# Patient Record
Sex: Male | Born: 1955 | Race: White | Hispanic: No | Marital: Married | Smoking: Former smoker
Health system: Southern US, Community
[De-identification: ages and names within clinical notes are randomized; demographics above are authoritative.]

## PROBLEM LIST (undated history)

## (undated) DIAGNOSIS — E7800 Pure hypercholesterolemia, unspecified: Secondary | ICD-10-CM

## (undated) DIAGNOSIS — G4730 Sleep apnea, unspecified: Secondary | ICD-10-CM

## (undated) DIAGNOSIS — F329 Major depressive disorder, single episode, unspecified: Secondary | ICD-10-CM

## (undated) DIAGNOSIS — F32A Depression, unspecified: Secondary | ICD-10-CM

## (undated) DIAGNOSIS — K219 Gastro-esophageal reflux disease without esophagitis: Secondary | ICD-10-CM

## (undated) DIAGNOSIS — M1990 Unspecified osteoarthritis, unspecified site: Secondary | ICD-10-CM

## (undated) DIAGNOSIS — J449 Chronic obstructive pulmonary disease, unspecified: Secondary | ICD-10-CM

## (undated) DIAGNOSIS — A770 Spotted fever due to Rickettsia rickettsii: Secondary | ICD-10-CM

## (undated) DIAGNOSIS — D649 Anemia, unspecified: Secondary | ICD-10-CM

## (undated) DIAGNOSIS — I10 Essential (primary) hypertension: Secondary | ICD-10-CM

## (undated) HISTORY — DX: Sleep apnea, unspecified: G47.30

## (undated) HISTORY — DX: Spotted fever due to Rickettsia rickettsii: A77.0

## (undated) HISTORY — PX: CHOLECYSTECTOMY: SHX55

## (undated) HISTORY — PX: KNEE ARTHROSCOPY: SUR90

---

## 2001-11-28 ENCOUNTER — Encounter: Payer: Self-pay | Admitting: Internal Medicine

## 2001-11-28 ENCOUNTER — Ambulatory Visit (HOSPITAL_COMMUNITY): Admission: RE | Admit: 2001-11-28 | Discharge: 2001-11-28 | Payer: Self-pay | Admitting: Internal Medicine

## 2003-05-19 ENCOUNTER — Ambulatory Visit (HOSPITAL_COMMUNITY): Admission: RE | Admit: 2003-05-19 | Discharge: 2003-05-19 | Payer: Self-pay | Admitting: Internal Medicine

## 2003-10-23 ENCOUNTER — Ambulatory Visit (HOSPITAL_COMMUNITY): Admission: RE | Admit: 2003-10-23 | Discharge: 2003-10-23 | Payer: Self-pay | Admitting: Internal Medicine

## 2005-03-03 ENCOUNTER — Ambulatory Visit: Payer: Self-pay | Admitting: Internal Medicine

## 2005-03-15 HISTORY — PX: COLONOSCOPY: SHX5424

## 2005-03-21 ENCOUNTER — Encounter: Payer: Self-pay | Admitting: Internal Medicine

## 2005-03-21 ENCOUNTER — Ambulatory Visit: Admission: RE | Admit: 2005-03-21 | Discharge: 2005-03-21 | Payer: Self-pay | Admitting: Internal Medicine

## 2005-03-25 ENCOUNTER — Ambulatory Visit: Payer: Self-pay | Admitting: Internal Medicine

## 2005-03-25 ENCOUNTER — Encounter: Payer: Self-pay | Admitting: Internal Medicine

## 2005-03-25 ENCOUNTER — Ambulatory Visit (HOSPITAL_COMMUNITY): Admission: RE | Admit: 2005-03-25 | Discharge: 2005-03-25 | Payer: Self-pay | Admitting: Internal Medicine

## 2005-03-29 ENCOUNTER — Ambulatory Visit: Payer: Self-pay | Admitting: Pulmonary Disease

## 2005-04-25 ENCOUNTER — Ambulatory Visit: Payer: Self-pay | Admitting: Pulmonary Disease

## 2005-05-08 ENCOUNTER — Ambulatory Visit: Admission: RE | Admit: 2005-05-08 | Discharge: 2005-05-08 | Payer: Self-pay | Admitting: Pulmonary Disease

## 2005-05-08 ENCOUNTER — Ambulatory Visit: Payer: Self-pay | Admitting: Pulmonary Disease

## 2005-05-25 ENCOUNTER — Ambulatory Visit: Payer: Self-pay | Admitting: Pulmonary Disease

## 2005-07-28 ENCOUNTER — Ambulatory Visit: Payer: Self-pay | Admitting: Pulmonary Disease

## 2005-10-07 ENCOUNTER — Ambulatory Visit: Payer: Self-pay | Admitting: Pulmonary Disease

## 2006-09-18 ENCOUNTER — Ambulatory Visit (HOSPITAL_COMMUNITY): Admission: RE | Admit: 2006-09-18 | Discharge: 2006-09-18 | Payer: Self-pay | Admitting: Internal Medicine

## 2006-12-25 ENCOUNTER — Ambulatory Visit (HOSPITAL_COMMUNITY): Admission: RE | Admit: 2006-12-25 | Discharge: 2006-12-25 | Payer: Self-pay | Admitting: Internal Medicine

## 2009-01-01 ENCOUNTER — Ambulatory Visit: Payer: Self-pay | Admitting: Internal Medicine

## 2009-01-01 DIAGNOSIS — G4733 Obstructive sleep apnea (adult) (pediatric): Secondary | ICD-10-CM

## 2009-01-04 DIAGNOSIS — J309 Allergic rhinitis, unspecified: Secondary | ICD-10-CM | POA: Insufficient documentation

## 2009-01-04 DIAGNOSIS — I10 Essential (primary) hypertension: Secondary | ICD-10-CM | POA: Insufficient documentation

## 2009-01-13 ENCOUNTER — Telehealth (INDEPENDENT_AMBULATORY_CARE_PROVIDER_SITE_OTHER): Payer: Self-pay | Admitting: *Deleted

## 2009-01-27 ENCOUNTER — Encounter: Payer: Self-pay | Admitting: Internal Medicine

## 2009-02-01 ENCOUNTER — Telehealth: Payer: Self-pay | Admitting: Internal Medicine

## 2010-05-11 ENCOUNTER — Ambulatory Visit (HOSPITAL_COMMUNITY): Admission: RE | Admit: 2010-05-11 | Discharge: 2010-05-11 | Payer: Self-pay | Admitting: Internal Medicine

## 2010-05-24 ENCOUNTER — Ambulatory Visit (HOSPITAL_COMMUNITY): Admission: RE | Admit: 2010-05-24 | Discharge: 2010-05-24 | Payer: Self-pay | Admitting: Internal Medicine

## 2010-06-18 ENCOUNTER — Ambulatory Visit (HOSPITAL_COMMUNITY)
Admission: RE | Admit: 2010-06-18 | Discharge: 2010-06-18 | Payer: Self-pay | Source: Home / Self Care | Attending: Internal Medicine | Admitting: Internal Medicine

## 2010-11-24 ENCOUNTER — Other Ambulatory Visit (HOSPITAL_COMMUNITY): Payer: Self-pay | Admitting: Internal Medicine

## 2010-11-24 DIAGNOSIS — K769 Liver disease, unspecified: Secondary | ICD-10-CM

## 2010-11-26 NOTE — Procedures (Signed)
[name], Ryan [name]               ACCOUNT NO.:  [id number]   MEDICAL RECORD NO.:  [id number]          PATIENT TYPE:  OUT   LOCATION:  SLEEP CENTER                  FACILITY:  [facility]   PHYSICIAN:  Coralyn Helling, M.D.      DATE OF BIRTH:  [birth year]   DATE OF STUDY:  05/08/2005                              NOCTURNAL POLYSOMNOGRAM   DATE OF STUDY:  05/08/05   DATE OF BIRTH:  2056-06-23   INDICATION FOR STUDY:  This is a patient who had undergone an overnight  polysomnogram which showed evidence for obstructive sleep apnea and he was  referred to the sleep lab for a CPAP titration study.  His Epworth  sleepiness score is 5/24.   MEDICATIONS:  1.  Diovan.  2.  Norvasc.  3.  Zocor.   SLEEP ARCHITECTURE:  Total record time was 381.5 minutes.  Total sleep time  was 274 minutes.  Sleep efficiency was 72%.  There were 30 minutes of Stage  I sleep, 158.5 minutes of Stage II sleep, 39.5 minutes of stage slow wave  sleep and 46.5 minutes of REM sleep.  Sleep latency was 28.5 minutes which  is slightly prolonged.  REM latency is 189 minutes which was slightly  prolonged.  The patient was observed in both the supine and non-supine  positions.   RESPIRATORY DATA:  The patient was titrated from a pressure setting of 5 to  11 cm of water.  Snoring was noted in the initial parts of the study and  snoring was eliminated at 11 cm of water pressure and at 11 cm of water  pressure the apnea hypopnea index was reduced to 0.8 and he was observed in  both the supine position and in REM at this pressure setting and he appeared  to have improvement in his sleep architecture as well.   OXYGEN DATA:  His oxygen saturation nadir was 87%.  His mean oxygenation  during sleep was 93% and mean oxygenation during non-REM sleep was 92%.  Mean oxygenation during REM sleep was 92%.  He spent 369.7 minutes of the  test time with oxygenation between 91% to 100% and 11.3 minutes with  oxygenation between 81% and 90% and  on pressure setting of 11, his minimal  oxygenation was 90% with a mean of 93%.   CARDIAC DATA:  He had normal sinus rhythm with sinus bradycardia.   MOVEMENTS/PARASOMNIA:  He had a total of 396 periodic limb movements and 54  were associated with arousals.  His overall periodic limb movement index was  11.8.   IMPRESSION:  The patient was titrated from a pressure setting of 5 to 11 cm  of water at a presetting of 11 cm of water.  His sleep architecture  improved.  His apnea hypopnea index was reduced to 0.8 and his oxygenation  had stabilized and he was observed in both a supine position and REM sleep  and snoring was eliminated at this pressure setting and he was fitted with a  ComfortLite 2 with a small cushion size.  Therefore, the patient should be  set up with a CPAP with the pressure setting of  11 cm of water and monitored  for clinical response.      Coralyn Helling, M.D.  Diplomat, Biomedical engineer of Sleep Medicine  Electronically Signed     VS/MEDQ  D:  05/11/2005 13:57:52  T:  05/11/2005 19:25:58  Job:  [id number]

## 2010-11-26 NOTE — Op Note (Signed)
[name], Ryan               ACCOUNT NO.:  [id number]   MEDICAL RECORD NO.:  [id number]          PATIENT TYPE:  AMB   LOCATION:  DAY                           FACILITY:  [facility]   PHYSICIAN:  R. Roetta Sessions, M.D. DATE OF BIRTH:  [birth year]   DATE OF PROCEDURE:  03/25/2005  DATE OF DISCHARGE:                                 OPERATIVE REPORT   PROCEDURE:  Colonoscopy with biopsy.   INDICATIONS FOR PROCEDURE:  The patient is a 55 year old gentleman sent at  the courtesy of Dr. Carylon Perches for further evaluation of hemoccult positive  stool on recent digital exam.   [name] is really devoid of any lower GI symptoms. He has never had his  colon imaged. There is no family history of colorectal neoplasia, although  his mother died with metastatic carcinoma of unknown primary. Colonoscopy is  now being done to further evaluate hemoccult positive stools. This approach  has been discussed with the patient at length.  Potential risks, benefits,  and alternatives have been reviewed and questions answered. He is agreeable.  Please see documentation in the medical record.   PROCEDURE NOTE:  O2 saturation, blood pressure, pulse, and respirations were  monitored throughout the entire procedure. Conscious sedation with Versed 4  mg IV and Demerol 75 mg IV in divided doses.   INSTRUMENT:  Olympus video chip system.   FINDINGS:  Digital rectal exam revealed no abnormalities.   ENDOSCOPIC FINDINGS:  Prep was adequate.   Rectum:  Examination of the rectal mucosa including retroflexed view of the  anal verge revealed two rectal polyps, approximately 4 mm in diameter each,  one at 8 cm, one at 12 cm over the anal verge. The remainder of the rectal  mucosa appeared normal.   Colon:  Colonic mucosa was surveyed from the rectosigmoid junction through  the left, transverse, and right colon to the area of the appendiceal  orifice, ileocecal valve, and cecum. These structures were well seen and  photographed for the record. From this level, the scope was slowly  withdrawn, and all previously mentioned mucosal surfaces were again seen.  The colonic mucosa appeared normal. Two polyps in the rectum were cold  biopsied/removed. The patient tolerated the procedure well and was reactive  to endoscopy   IMPRESSION:  1.  Rectal polyps (diminutive), status post cold biopsy/removal. The      remainder of rectal mucosa appeared normal.  2.  Normal colon.   RECOMMENDATIONS:  1.  Follow up on pathology.  2.  Further recommendations to follow.      [name], M.D.  Electronically Signed     RMR/MEDQ  D:  03/25/2005  T:  03/25/2005  Job:  [id number]   cc:   Kingsley Callander. Ouida Sills, MD  [address]  [place]  [place] [address]  Fax: [phone]

## 2010-11-26 NOTE — Procedures (Signed)
[name], Ryan [name]               ACCOUNT NO.:  [id number]   MEDICAL RECORD NO.:  [id number]          PATIENT TYPE:  OUT   LOCATION:  SLEEP LAB                     FACILITY:  [facility]   PHYSICIAN:  Marcelyn Bruins, M.D. [facility] DATE OF BIRTH:  14-Mar-1956   DATE OF STUDY:  03/21/2005                              NOCTURNAL POLYSOMNOGRAM   REFERRING PHYSICIAN:  Dr. Carylon Perches   DATE OF STUDY:  March 21, 2005   INDICATION FOR THE STUDY:  Hypersomnia with sleep apnea. Epworth score: 7.   SLEEP ARCHITECTURE:  The patient had a total sleep time of 333 minutes with  adequate slow wave sleep but decreased REM, sleep onset latency was  prolonged at 45 minutes and REM onset was at the upper limits of normal.  Sleep efficiency was 84%.   RESPIRATORY DATA:  The patient was found to have a respiratory disturbance  index of 35 events per hour and were associated with loud to very loud  snoring. The events were clearly worse during REM.   OXYGEN DATA:  The patient had O2 desaturation as low as 85% associated with  his obstructive events.   CARDIAC DATA:  No clinically significant cardiac arrhythmias.   MOVEMENT/PARASOMNIA:  The patient was found to have very large numbers of  leg jerks with 81 of them resulting in arousal or awakening. This gave the  patient an arousal index of 15 events per hour.   IMPRESSION/RECOMMENDATION:  1.  Moderate obstructive sleep apnea/hypopnea syndrome with O2 desaturation      as low as 85%. Treatment for this degree of sleep apnea may include      weight loss, upper airway surgery, oral appliance, and also continuous      positive airway pressure. Clinical correlation is suggested.  2.  Large numbers of leg jerks with significant sleep disruption. More than      likely this is associated with the patient's obstructive sleep apnea,      however, consider additional treatment if the patient does not respond      appropriately to treatment of the patient's obstructive  events.                                           ______________________________  Marcelyn Bruins, M.D. [facility]  Diplomate, American Board of Sleep  Medicine    KC/MEDQ  D:  03/29/2005 08:51:38  T:  03/29/2005 18:38:21  Job:  [id number]

## 2010-11-26 NOTE — Consult Note (Signed)
NAMEMARSH, HECKLER               ACCOUNT NO.:  [id number]   MEDICAL RECORD NO.:  [id number]           PATIENT TYPE:  AMB   LOCATION:                                FACILITY:  [facility]   PHYSICIAN:  R. Roetta Sessions, M.D. DATE OF BIRTH:  1956-05-13   DATE OF CONSULTATION:  03/03/2005  DATE OF DISCHARGE:                                   CONSULTATION   REASON FOR CONSULTATION:  Hemoccult-positive stool.   HISTORY OF PRESENT ILLNESS:  Mr. Belal Scallon is a pleasant 55 year old  Caucasian male (who will be 50 in six days) referred at the courtesy of Dr.  Carylon Perches for further evaluation of Hemoccult-positive stool detected on  digital rectal examination during a routine physical recently.   Mr. Tim Lair has never seen any melena or blood per rectum.  He has a tendency  towards loose stool since Dr. Leona Carry removed his gallbladder 4-5 years  ago.  He has not had any abdominal pain.  No change in weight.  He has  occasional reflux symptoms, for which he takes Prevacid on the order of 4-5  times monthly.  There is no family history of colorectal neoplasia, although  his mother died of metastatic carcinoma, unknown primary, in her 75s.  He  has never had his lower GI tract imaged.   PAST MEDICAL HISTORY:  1.  Hypertension.  2.  Hypercholesterolemia.   PAST SURGICAL HISTORY:  Cholecystectomy by Dr. Leona Carry 4-5 years ago.   CURRENT MEDICATIONS:  1.  Zocor 40 mg at bedtime.  2.  Norvasc 5 mg daily.  3.  Diovan 160 mg daily.  4.  Prevacid 30 mg p.r.n.  5.  Sonata 10 mg p.r.n.   ALLERGIES:  No known drug allergies.   FAMILY HISTORY:  Father died with metastatic prostate cancer.  Mother died  with metastatic carcinoma, unknown primary.  Otherwise negative for chronic  GI or liver illness.   SOCIAL HISTORY:  The patient is married.  He has no children.  He is  employed with [employer] of [place], [place].  He is a  former smoker.  He has not smoked any in two years.   Occasionally, he has an  alcoholic beverage.   REVIEW OF SYSTEMS:  No odynophagia, dysphagia, early satiety, nausea,  vomiting or abdominal pain.  No chest pain or dyspnea on exertion.  No  change in weight.  No fever or chills.   PHYSICAL EXAMINATION:  GENERAL APPEARANCE:  Physical examination reveals a  pleasant, bearded 55 year old gentleman, resting comfortably.  VITAL SIGNS:  Weight 248.5.  Height 5-foot-10-1/2.  Temp 98.1, BP 122/76,  pulse 84.  SKIN:  Warm and dry.  No jaundice.  No cutaneous stigmata of chronic liver  disease.  HEENT:  The sclerae are anicteric.  JVD is not prominent.  Oral cavity:  No  lesions.  Dentition in a fair state of repair.  CHEST:  Lungs are clear to auscultation.  CARDIAC:  Regular rate and rhythm without murmur, gallop or rub.  ABDOMEN:  Nondistended.  Positive bowel sounds.  Obese.  Soft and  nontender.  No appreciable mass or hepatosplenomegaly.  EXTREMITIES:  No edema.  RECTAL:  Deferred to the time of colonoscopy.   IMPRESSION:  Mr. Messi Twedt is a pleasant 55 year old gentleman who was  found to be Hemoccult-positive on recent digital rectal examination.  Not  mentioned above, he did make the observation that he ate a very rare piece  of steak tonight before his rectal exam.  This may or may not be  significant.  At any rate, he needs lower GI tract images that are best  accomplished via colonoscopy.  I have discussed the approach of colonoscopy  with Mr. Tim Lair.  Potential risks, benefits and alternatives have been  reviewed and questions answered.  He is agreeable.  We will plan to perform  a colonoscopy in the very near future at [facility] and make  further recommendations after the procedure has been performed.   I would like to thank Dr. Carylon Perches for allowing me to assist with this nice  gentleman today.      [name], M.D.  Electronically Signed     RMR/MEDQ  D:  03/03/2005  T:  03/03/2005  Job:   [id number]   cc:   Kingsley Callander. Ouida Sills, MD  [address]  [place]  [place] [address]  Fax: [phone]

## 2010-11-30 ENCOUNTER — Ambulatory Visit (HOSPITAL_COMMUNITY)
Admission: RE | Admit: 2010-11-30 | Discharge: 2010-11-30 | Disposition: A | Discharge status: Home / Self Care | Payer: BC Managed Care – PPO | Source: Ambulatory Visit | Attending: Internal Medicine | Admitting: Internal Medicine

## 2010-11-30 ENCOUNTER — Encounter (HOSPITAL_COMMUNITY): Payer: Self-pay

## 2010-11-30 DIAGNOSIS — K769 Liver disease, unspecified: Secondary | ICD-10-CM

## 2010-11-30 DIAGNOSIS — K7689 Other specified diseases of liver: Secondary | ICD-10-CM | POA: Insufficient documentation

## 2010-11-30 DIAGNOSIS — I10 Essential (primary) hypertension: Secondary | ICD-10-CM | POA: Insufficient documentation

## 2010-11-30 HISTORY — DX: Essential (primary) hypertension: I10

## 2010-11-30 MED ORDER — IOHEXOL 300 MG/ML  SOLN
100.0000 mL | Freq: Once | INTRAMUSCULAR | Status: AC | PRN
  Administered 2010-11-30: 100 mL via INTRAVENOUS

## 2011-12-02 ENCOUNTER — Emergency Department (HOSPITAL_COMMUNITY)
Admission: EM | Admit: 2011-12-02 | Discharge: 2011-12-02 | Disposition: A | Discharge status: Home / Self Care | Payer: BC Managed Care – PPO | Attending: Emergency Medicine | Admitting: Emergency Medicine

## 2011-12-02 ENCOUNTER — Encounter (HOSPITAL_COMMUNITY): Payer: Self-pay | Admitting: *Deleted

## 2011-12-02 ENCOUNTER — Emergency Department (HOSPITAL_COMMUNITY): Payer: BC Managed Care – PPO

## 2011-12-02 DIAGNOSIS — S29011A Strain of muscle and tendon of front wall of thorax, initial encounter: Secondary | ICD-10-CM

## 2011-12-02 DIAGNOSIS — R079 Chest pain, unspecified: Secondary | ICD-10-CM | POA: Insufficient documentation

## 2011-12-02 HISTORY — DX: Unspecified osteoarthritis, unspecified site: M19.90

## 2011-12-02 HISTORY — DX: Chronic obstructive pulmonary disease, unspecified: J44.9

## 2011-12-02 HISTORY — DX: Pure hypercholesterolemia, unspecified: E78.00

## 2011-12-02 LAB — CBC
HCT: 38.4 % — ABNORMAL LOW (ref 39.0–52.0)
Hemoglobin: 12.9 g/dL — ABNORMAL LOW (ref 13.0–17.0)
MCH: 32 pg (ref 26.0–34.0)
MCHC: 33.6 g/dL (ref 30.0–36.0)
MCV: 95.3 fL (ref 78.0–100.0)
Platelets: 205 10*3/uL (ref 150–400)
RBC: 4.03 MIL/uL — ABNORMAL LOW (ref 4.22–5.81)
RDW: 11.9 % (ref 11.5–15.5)
WBC: 9.6 10*3/uL (ref 4.0–10.5)

## 2011-12-02 LAB — BASIC METABOLIC PANEL
BUN: 14 mg/dL (ref 6–23)
CO2: 29 mEq/L (ref 19–32)
Calcium: 10.1 mg/dL (ref 8.4–10.5)
Chloride: 99 mEq/L (ref 96–112)
Creatinine, Ser: 0.78 mg/dL (ref 0.50–1.35)
GFR calc Af Amer: >90 mL/min (ref >90)
GFR calc non Af Amer: >90 mL/min (ref >90)
Glucose, Bld: 105 mg/dL — ABNORMAL HIGH (ref 70–99)
Potassium: 5.1 mEq/L (ref 3.5–5.1)
Sodium: 136 mEq/L (ref 135–145)

## 2011-12-02 LAB — TROPONIN I: Troponin I: <0.3 ng/mL (ref <0.30)

## 2011-12-02 NOTE — ED Notes (Signed)
Pt c/o left sided sharp chest pain x 1 week but only when takes a deep breath. Denies any n/v or SOB.  Reports pain does not radiate.  Denies injury or cough.

## 2011-12-02 NOTE — ED Provider Notes (Signed)
History     CSN: [id number]  Arrival date & time 12/02/11  0272   First MD Initiated Contact with Patient 12/02/11 [phone]      Chief Complaint  Patient presents with  . Chest Pain    (Consider location/radiation/quality/duration/timing/severity/associated sxs/prior treatment) HPI Comments: Patient c/o pain to his left lateral chest for one week.  States the pain is worse with certain movements, palpation and cough.  Improves with rest and lying supine.  He has also been evaluated by his pulmonologist earlier this week for same and came to ED requesting a "second opinion",  He also reports an occasional cough that is mostly non-productive.  He denies SOB, fever, numbness, weakness or abd pain.    Patient is a 56 y.o. male presenting with chest pain. The history is provided by the patient.  Chest Pain The chest pain began 5 - 7 days ago. Chest pain occurs intermittently. The chest pain is unchanged. The pain is associated with breathing and lifting (palpation). The severity of the pain is mild. The quality of the pain is described as aching and dull. The pain does not radiate. Chest pain is worsened by certain positions, deep breathing and exertion. Primary symptoms include cough. Pertinent negatives for primary symptoms include no fever, no fatigue, no syncope, no shortness of breath, no wheezing, no palpitations, no abdominal pain, no nausea, no vomiting, no dizziness and no altered mental status.  The cough is non-productive. The sputum is clear.  Pertinent negatives for associated symptoms include no numbness and no weakness.  His past medical history is significant for COPD and diabetes.     Past Medical History  Diagnosis Date  . Hypertension   . COPD (chronic obstructive pulmonary disease)   . High cholesterol   . Arthritis   . Asthma     Past Surgical History  Procedure Date  . Cholecystectomy     No family history on file.  History  Substance Use Topics  . Smoking  status: Never Smoker   . Smokeless tobacco: Not on file  . Alcohol Use: Yes     daily      Review of Systems  Constitutional: Negative for fever, chills, activity change, appetite change and fatigue.  HENT: Negative for sore throat, trouble swallowing, neck pain and neck stiffness.   Respiratory: Positive for cough. Negative for chest tightness, shortness of breath and wheezing.   Cardiovascular: Positive for chest pain. Negative for palpitations, leg swelling and syncope.  Gastrointestinal: Negative for nausea, vomiting, abdominal pain, blood in stool and abdominal distention.  Genitourinary: Negative for dysuria, hematuria and flank pain.  Musculoskeletal: Positive for arthralgias. Negative for myalgias, back pain and gait problem.  Skin: Negative for rash.  Neurological: Negative for dizziness, weakness and numbness.  Hematological: Does not bruise/bleed easily.  Psychiatric/Behavioral: Negative for altered mental status.  All other systems reviewed and are negative.    Allergies  Latex  Home Medications  No current outpatient prescriptions on file.  BP 127/77  Pulse 76  Temp(Src) 98.2 F (36.8 C) (Oral)  Resp 20  Ht 5\' 10"  (1.778 m)  Wt 190 lb (86.183 kg)  BMI 27.26 kg/m2  SpO2 95%  Physical Exam  Nursing note and vitals reviewed. Constitutional: He is oriented to person, place, and time. He appears well-developed and well-nourished. No distress.  HENT:  Head: Normocephalic and atraumatic.  Mouth/Throat: Oropharynx is clear and moist.  Neck: Normal range of motion. Neck supple.  Cardiovascular: Normal rate, regular rhythm and  normal heart sounds.   Pulmonary/Chest: Effort normal and breath sounds normal. No respiratory distress. He has no decreased breath sounds. He has no wheezes. He has no rales. Chest wall is not dull to percussion. He exhibits tenderness. He exhibits no mass, no laceration, no crepitus, no edema, no deformity, no swelling and no retraction.     Abdominal: Soft. He exhibits no distension. There is no tenderness. There is no rebound and no guarding.  Musculoskeletal: Normal range of motion. He exhibits no tenderness.  Lymphadenopathy:    He has no cervical adenopathy.  Neurological: He is alert and oriented to person, place, and time. He exhibits normal muscle tone. Coordination normal.  Skin: Skin is warm and dry.    ED Course  Procedures (including critical care time)  Labs Reviewed  CBC - Abnormal; Notable for the following:    RBC 4.03 (*)    Hemoglobin 12.9 (*)    HCT 38.4 (*)    All other components within normal limits  BASIC METABOLIC PANEL - Abnormal; Notable for the following:    Glucose, Bld 105 (*)    All other components within normal limits  TROPONIN I       MDM    Date: 12/02/2011  Rate:73  Rhythm: normal sinus rhythm  QRS Axis: normal  Intervals: normal  ST/T Wave abnormalities: normal  Conduction Disutrbances:none  Narrative Interpretation:   Old EKG Reviewed: none available    EKG read by Dr. Adriana Simas   Previous medical charts, nursing notes and vitals signs from this visit were reviewed by me   All laboratory results and/or imaging results performed on this visit, if applicable, were reviewed by me and discussed with the patient and/or parent as well as recommendation for follow-up    MEDICATIONS GIVEN IN ED:  none  Patient brought chest x-ray from 11/28/2011 from his pulmonologist office in [place]. X-ray was reviewed by myself and Dr. Adriana Simas.  Patient is alert. Nontoxic appearing. Has localized tenderness to the left upper chest wall. Pain is reproduced with movement and deep inspiration. Symptoms are likely related to musculoskeletal pain. No tachycardia, tachypnea, or hypoxia. Doubtful of PE. I have discussed patient's symptoms and care plan with the EDP. Patient has also been seen by Dr. Adriana Simas. He agrees to close followup with his primary care physician or to return to ER if  his symptoms worsen.    PRESCRIPTIONS GIVEN AT DISCHARGE: none   Pt stable in ED with no significant deterioration in condition. Pt feels improved after observation and/or treatment in ED. Patient / Family / Caregiver understand and agree with initial ED impression and plan with expectations set for ED visit.  Patient agrees to return to ED for any worsening symptoms     Jessilyn Catino L. Trisha Mangle, [place] 12/08/11 1341

## 2011-12-02 NOTE — ED Notes (Signed)
Left sided rib pain x 6 days, worse with deep breath and movement.  Reports seen pulmonologist x 5 days ago.  Denies dizziness/lightheadedness, denies n/v.

## 2011-12-11 NOTE — ED Provider Notes (Signed)
Medical screening examination/treatment/procedure(s) were conducted as a shared visit with non-physician practitioner(s) and myself.  I personally evaluated the patient during the encounter.  doubt cardiac chest pain. ED workup negative. Will return if worse  Donnetta Hutching, MD 12/11/11 [phone]

## 2013-12-19 ENCOUNTER — Ambulatory Visit (HOSPITAL_COMMUNITY)
Admission: RE | Admit: 2013-12-19 | Discharge: 2013-12-19 | Disposition: A | Discharge status: Home / Self Care | Payer: BC Managed Care – PPO | Source: Ambulatory Visit | Attending: Internal Medicine | Admitting: Internal Medicine

## 2013-12-19 ENCOUNTER — Other Ambulatory Visit (HOSPITAL_COMMUNITY): Payer: Self-pay | Admitting: Internal Medicine

## 2013-12-19 DIAGNOSIS — M542 Cervicalgia: Secondary | ICD-10-CM

## 2013-12-19 DIAGNOSIS — M47812 Spondylosis without myelopathy or radiculopathy, cervical region: Secondary | ICD-10-CM | POA: Insufficient documentation

## 2014-07-10 ENCOUNTER — Encounter: Payer: Self-pay | Admitting: Internal Medicine

## 2014-08-08 ENCOUNTER — Ambulatory Visit (INDEPENDENT_AMBULATORY_CARE_PROVIDER_SITE_OTHER): Payer: BLUE CROSS/BLUE SHIELD | Admitting: Gastroenterology

## 2014-08-08 ENCOUNTER — Encounter: Payer: Self-pay | Admitting: Gastroenterology

## 2014-08-08 ENCOUNTER — Other Ambulatory Visit: Payer: Self-pay

## 2014-08-08 VITALS — BP 143/84 | HR 86 | Temp 98.1°F | Ht 70.0 in | Wt 215.8 lb

## 2014-08-08 DIAGNOSIS — D509 Iron deficiency anemia, unspecified: Secondary | ICD-10-CM | POA: Insufficient documentation

## 2014-08-08 MED ORDER — PEG-KCL-NACL-NASULF-NA ASC-C 100 G PO SOLR: 1.0000 | Freq: Once | ORAL | Status: AC

## 2014-08-08 NOTE — Patient Instructions (Signed)
We have scheduled you for a colonoscopy and upper endoscopy with Dr. Rourk in the near future.  Further recommendations to follow!  

## 2014-08-08 NOTE — Progress Notes (Signed)
Referring Provider: Asencion Noble, MD Primary Care Physician:  Asencion Noble, MD Primary Gastroenterologist:  Dr. Gala Romney   Chief Complaint  Patient presents with  . Anemia    HPI:   Ryan [name] is a 59 y.o. male presenting today at the request of Asencion Noble, MD secondary to [place]. Last colonoscopy in 2006 with hyperplastic polyps. Recent Hgb 12.4, with ferritin 7, iron low normal at 46.    No hematochezia or melena. No changes in bowel habits. No abdominal pain. No unexplained weight loss or lack of appetite. No reflux or dysphagia. Denies significant fatigue. meloxicam 1/2 tab every other day.   Purposeful weight loss of 75 lbs about 4 years ago, coming off hypertensive and cholesterol agents.     Past Medical History  Diagnosis Date  . Hypertension     diet controlled  . COPD (chronic obstructive pulmonary disease)   . High cholesterol     diet controlled  . Arthritis   . Asthma   . Sleep apnea     CPAP    Past Surgical History  Procedure Laterality Date  . Cholecystectomy    . Colonoscopy  03/15/2005    Dr. Rourk:Rectal polyps (diminutive), status post cold biopsy/removal/normal colon. hyperplastic    Current Outpatient Prescriptions  Medication Sig Dispense Refill  . acetaminophen (TYLENOL) 500 MG tablet Take by mouth.    . fluticasone-salmeterol (ADVAIR HFA) 115-21 MCG/ACT inhaler Inhale into the lungs.    . gabapentin (NEURONTIN) 100 MG capsule Take by mouth.    . lansoprazole (PREVACID SOLUTAB) 15 MG disintegrating tablet Take by mouth.    . levalbuterol (XOPENEX HFA) 45 MCG/ACT inhaler Inhale into the lungs.    . meloxicam (MOBIC) 15 MG tablet Take by mouth.    . mirtazapine (REMERON) 15 MG tablet Take by mouth.    . mometasone (NASONEX) 50 MCG/ACT nasal spray     . traMADol (ULTRAM) 50 MG tablet Take by mouth.    . zolpidem (AMBIEN) 10 MG tablet Take by mouth.    . peg 3350 powder (MOVIPREP) 100 G SOLR Take 1 kit (200 g total) by mouth once. 1 kit 0   No  current facility-administered medications for this visit.    Allergies as of 08/08/2014 - Review Complete 12/02/2011  Allergen Reaction Noted  . Latex  11/30/2010    Family History  Problem Relation Age of Onset  . Colon cancer Neg Hx   . Cancer Mother     unsure primary    History   Social History  . Marital Status: Married    Spouse Name: N/A    Number of Children: N/A  . Years of Education: N/A   Occupational History  . [employer]    Social History Main Topics  . Smoking status: Never Smoker   . Smokeless tobacco: Not on file  . Alcohol Use: 0.0 oz/week    0 Not specified per week     Comment: couple of drinks of borboun every night  . Drug Use: No  . Sexual Activity: Not on file   Other Topics Concern  . Not on file   Social History Narrative    Review of Systems: Gen: Denies any fever, chills, loss of appetite, fatigue, weight loss. CV: Denies chest pain, heart palpitations, syncope, peripheral edema. Resp: Denies shortness of breath with rest, cough, wheezing GI: see HPI GU : Denies urinary burning, urinary frequency, urinary incontinence.  MS: Denies joint pain, muscle weakness, cramps, limited  movement Derm: Denies rash, itching, dry skin Psych: Denies depression, anxiety, confusion or memory loss  Heme: Denies bruising, bleeding, and enlarged lymph nodes.  Physical Exam: BP 143/84 mmHg  Pulse 86  Temp(Src) 98.1 F (36.7 C) (Oral)  Ht $R'5\' 10"'et$  (1.778 m)  Wt 215 lb 12.8 oz (97.886 kg)  BMI 30.96 kg/m2 General:   Alert and oriented. Well-developed, well-nourished, pleasant and cooperative. Head:  Normocephalic and atraumatic. Eyes:  Conjunctiva pink, sclera clear, no icterus.   Conjunctiva pink. Ears:  Normal auditory acuity. Nose:  No deformity, discharge,  or lesions. Mouth:  No deformity or lesions, mucosa pink and moist.  Lungs:  Clear to auscultation bilaterally, without wheezing, rales, or rhonchi.  Heart:  S1, S2 present  without murmurs noted.  Abdomen:  +BS, soft, non-tender and non-distended. Without mass or HSM. No rebound or guarding. No hernias noted. Rectal:  Deferred  Msk:  Symmetrical without gross deformities. Normal posture. Extremities:  Without edema. Neurologic:  Alert and  oriented x4;  grossly normal neurologically. Skin:  Intact, warm and dry without significant lesions or rashes Psych:  Alert and cooperative. Normal mood and affect.   Outside labs to be abstracted 06/2014:   Hgb 12.4, with ferritin 7, iron low normal at 46.

## 2014-08-13 NOTE — Assessment & Plan Note (Signed)
59 year old male with incidental findings of IDA without overt signs of GI bleeding or known hemoccult status. No concerning upper or lower GI symptoms of note. With last colonoscopy in 2006, needs colonoscopy and EGD to evaluate for etiology to IDA. As of note, takes meloxicam 1/2 tab every other day, otherwise no NSAIDs or aspirin powders.   Proceed with TCS/EGD with Dr. Gala Romney in near future: the risks, benefits, and alternatives have been discussed with the patient in detail. The patient states understanding and desires to proceed.

## 2014-08-21 LAB — HEMOGLOBIN
FERRITIN: 7
HCT: 38 %
Hgb Other: 12.4

## 2014-08-26 NOTE — Progress Notes (Signed)
cc'ed to pcp °

## 2014-08-27 ENCOUNTER — Encounter (HOSPITAL_COMMUNITY)
Admission: RE | Disposition: A | Discharge status: Home / Self Care | Payer: Self-pay | Source: Ambulatory Visit | Attending: Internal Medicine

## 2014-08-27 ENCOUNTER — Encounter (HOSPITAL_COMMUNITY): Payer: Self-pay | Admitting: *Deleted

## 2014-08-27 ENCOUNTER — Ambulatory Visit (HOSPITAL_COMMUNITY)
Admission: RE | Admit: 2014-08-27 | Discharge: 2014-08-27 | Disposition: A | Discharge status: Home / Self Care | Payer: BLUE CROSS/BLUE SHIELD | Source: Ambulatory Visit | Attending: Internal Medicine | Admitting: Internal Medicine

## 2014-08-27 DIAGNOSIS — D128 Benign neoplasm of rectum: Secondary | ICD-10-CM

## 2014-08-27 DIAGNOSIS — Z800 Family history of malignant neoplasm of digestive organs: Secondary | ICD-10-CM | POA: Insufficient documentation

## 2014-08-27 DIAGNOSIS — Z7951 Long term (current) use of inhaled steroids: Secondary | ICD-10-CM | POA: Diagnosis not present

## 2014-08-27 DIAGNOSIS — K2950 Unspecified chronic gastritis without bleeding: Secondary | ICD-10-CM | POA: Diagnosis not present

## 2014-08-27 DIAGNOSIS — E780 Pure hypercholesterolemia: Secondary | ICD-10-CM | POA: Insufficient documentation

## 2014-08-27 DIAGNOSIS — D509 Iron deficiency anemia, unspecified: Secondary | ICD-10-CM

## 2014-08-27 DIAGNOSIS — K635 Polyp of colon: Secondary | ICD-10-CM | POA: Diagnosis not present

## 2014-08-27 DIAGNOSIS — K449 Diaphragmatic hernia without obstruction or gangrene: Secondary | ICD-10-CM | POA: Insufficient documentation

## 2014-08-27 DIAGNOSIS — Z9049 Acquired absence of other specified parts of digestive tract: Secondary | ICD-10-CM | POA: Insufficient documentation

## 2014-08-27 DIAGNOSIS — Z91040 Latex allergy status: Secondary | ICD-10-CM | POA: Diagnosis not present

## 2014-08-27 DIAGNOSIS — J449 Chronic obstructive pulmonary disease, unspecified: Secondary | ICD-10-CM | POA: Insufficient documentation

## 2014-08-27 DIAGNOSIS — J45909 Unspecified asthma, uncomplicated: Secondary | ICD-10-CM | POA: Diagnosis not present

## 2014-08-27 DIAGNOSIS — Q2730 Arteriovenous malformation, site unspecified: Secondary | ICD-10-CM

## 2014-08-27 DIAGNOSIS — K921 Melena: Secondary | ICD-10-CM | POA: Diagnosis present

## 2014-08-27 DIAGNOSIS — Z860100 Personal history of colon polyps, unspecified: Secondary | ICD-10-CM | POA: Insufficient documentation

## 2014-08-27 DIAGNOSIS — K621 Rectal polyp: Secondary | ICD-10-CM | POA: Insufficient documentation

## 2014-08-27 DIAGNOSIS — D122 Benign neoplasm of ascending colon: Secondary | ICD-10-CM

## 2014-08-27 DIAGNOSIS — G4730 Sleep apnea, unspecified: Secondary | ICD-10-CM | POA: Insufficient documentation

## 2014-08-27 DIAGNOSIS — I10 Essential (primary) hypertension: Secondary | ICD-10-CM | POA: Diagnosis not present

## 2014-08-27 DIAGNOSIS — K5730 Diverticulosis of large intestine without perforation or abscess without bleeding: Secondary | ICD-10-CM | POA: Diagnosis not present

## 2014-08-27 DIAGNOSIS — K210 Gastro-esophageal reflux disease with esophagitis: Secondary | ICD-10-CM | POA: Diagnosis not present

## 2014-08-27 DIAGNOSIS — K2210 Ulcer of esophagus without bleeding: Secondary | ICD-10-CM | POA: Insufficient documentation

## 2014-08-27 DIAGNOSIS — K3189 Other diseases of stomach and duodenum: Secondary | ICD-10-CM

## 2014-08-27 HISTORY — PX: COLONOSCOPY: SHX5424

## 2014-08-27 HISTORY — PX: ESOPHAGOGASTRODUODENOSCOPY: SHX5428

## 2014-08-27 SURGERY — COLONOSCOPY
Anesthesia: Moderate Sedation

## 2014-08-27 MED ORDER — MIDAZOLAM HCL 5 MG/5ML IJ SOLN
INTRAMUSCULAR | Status: AC
  Filled 2014-08-27: qty 10

## 2014-08-27 MED ORDER — MEPERIDINE HCL 100 MG/ML IJ SOLN
INTRAMUSCULAR | Status: DC | PRN
  Administered 2014-08-27: 25 mg via INTRAVENOUS
  Administered 2014-08-27: 50 mg via INTRAVENOUS
  Administered 2014-08-27 (×2): 25 mg via INTRAVENOUS

## 2014-08-27 MED ORDER — ONDANSETRON HCL 4 MG/2ML IJ SOLN
INTRAMUSCULAR | Status: DC | PRN
  Administered 2014-08-27: 4 mg via INTRAVENOUS

## 2014-08-27 MED ORDER — STERILE WATER FOR IRRIGATION IR SOLN
Status: DC | PRN
  Administered 2014-08-27: 13:00:00

## 2014-08-27 MED ORDER — ONDANSETRON HCL 4 MG/2ML IJ SOLN
INTRAMUSCULAR | Status: AC
  Filled 2014-08-27: qty 2

## 2014-08-27 MED ORDER — SODIUM CHLORIDE 0.9 % IV SOLN
INTRAVENOUS | Status: DC
  Administered 2014-08-27: 12:00:00 via INTRAVENOUS

## 2014-08-27 MED ORDER — MEPERIDINE HCL 100 MG/ML IJ SOLN
INTRAMUSCULAR | Status: AC
  Filled 2014-08-27: qty 2

## 2014-08-27 MED ORDER — MIDAZOLAM HCL 5 MG/5ML IJ SOLN
INTRAMUSCULAR | Status: AC
  Filled 2014-08-27: qty 5

## 2014-08-27 MED ORDER — LIDOCAINE VISCOUS 2 % MT SOLN
OROMUCOSAL | Status: DC | PRN
  Administered 2014-08-27: 3 mL via OROMUCOSAL

## 2014-08-27 MED ORDER — MIDAZOLAM HCL 5 MG/5ML IJ SOLN
INTRAMUSCULAR | Status: DC | PRN
  Administered 2014-08-27 (×2): 1 mg via INTRAVENOUS
  Administered 2014-08-27: 2 mg via INTRAVENOUS
  Administered 2014-08-27 (×3): 1 mg via INTRAVENOUS
  Administered 2014-08-27: 2 mg via INTRAVENOUS
  Administered 2014-08-27: 1 mg via INTRAVENOUS
  Administered 2014-08-27: 2 mg via INTRAVENOUS

## 2014-08-27 MED ORDER — LIDOCAINE VISCOUS 2 % MT SOLN
OROMUCOSAL | Status: AC
  Filled 2014-08-27: qty 15

## 2014-08-27 NOTE — Discharge Instructions (Addendum)
Colonoscopy Discharge Instructions  Read the instructions outlined below and refer to this sheet in the next few weeks. These discharge instructions provide you with general information on caring for yourself after you leave the hospital. Your doctor may also give you specific instructions. While your treatment has been planned according to the most current medical practices available, unavoidable complications occasionally occur. If you have any problems or questions after discharge, call Dr. Gala Romney at [phone]. ACTIVITY  You may resume your regular activity, but move at a slower pace for the next 24 hours.   Take frequent rest periods for the next 24 hours.   Walking will help get rid of the air and reduce the bloated feeling in your belly (abdomen).   No driving for 24 hours (because of the medicine (anesthesia) used during the test).    Do not sign any important legal documents or operate any machinery for 24 hours (because of the anesthesia used during the test).  NUTRITION  Drink plenty of fluids.   You may resume your normal diet as instructed by your doctor.   Begin with a light meal and progress to your normal diet. Heavy or fried foods are harder to digest and may make you feel sick to your stomach (nauseated).   Avoid alcoholic beverages for 24 hours or as instructed.  MEDICATIONS  You may resume your normal medications unless your doctor tells you otherwise.  WHAT YOU CAN EXPECT TODAY  Some feelings of bloating in the abdomen.   Passage of more gas than usual.   Spotting of blood in your stool or on the toilet paper.  IF YOU HAD POLYPS REMOVED DURING THE COLONOSCOPY:  No aspirin products for 7 days or as instructed.   No alcohol for 7 days or as instructed.   Eat a soft diet for the next 24 hours.  FINDING OUT THE RESULTS OF YOUR TEST Not all test results are available during your visit. If your test results are not back during the visit, make an appointment  with your caregiver to find out the results. Do not assume everything is normal if you have not heard from your caregiver or the medical facility. It is important for you to follow up on all of your test results.  SEEK IMMEDIATE MEDICAL ATTENTION IF:  You have more than a spotting of blood in your stool.   Your belly is swollen (abdominal distention).   You are nauseated or vomiting.   You have a temperature over 101.  You have abdominal pain or discomfort that is severe or gets worse throughout the day. EGD Discharge instructions Please read the instructions outlined below and refer to this sheet in the next few weeks. These discharge instructions provide you with general information on caring for yourself after you leave the hospital. Your doctor may also give you specific instructions. While your treatment has been planned according to the most current medical practices available, unavoidable complications occasionally occur. If you have any problems or questions after discharge, please call your doctor. ACTIVITY You may resume your regular activity but move at a slower pace for the next 24 hours.  Take frequent rest periods for the next 24 hours.  Walking will help expel (get rid of) the air and reduce the bloated feeling in your abdomen.  No driving for 24 hours (because of the anesthesia (medicine) used during the test).  You may shower.  Do not sign any important legal documents or operate any machinery for 24  hours (because of the anesthesia used during the test).  NUTRITION Drink plenty of fluids.  You may resume your normal diet.  Begin with a light meal and progress to your normal diet.  Avoid alcoholic beverages for 24 hours or as instructed by your caregiver.  MEDICATIONS You may resume your normal medications unless your caregiver tells you otherwise.  WHAT YOU CAN EXPECT TODAY You may experience abdominal discomfort such as a feeling of fullness or gas pains.   FOLLOW-UP Your doctor will discuss the results of your test with you.  SEEK IMMEDIATE MEDICAL ATTENTION IF ANY OF THE FOLLOWING OCCUR: Excessive nausea (feeling sick to your stomach) and/or vomiting.  Severe abdominal pain and distention (swelling).  Trouble swallowing.  Temperature over 101 F (37.8 C).  Rectal bleeding or vomiting of blood.    GERD and polyp information provided  Further recommendations to follow pending review of pathology report  Colon Polyps Polyps are lumps of extra tissue growing inside the body. Polyps can grow in the large intestine (colon). Most colon polyps are noncancerous (benign). However, some colon polyps can become cancerous over time. Polyps that are larger than a pea may be harmful. To be safe, caregivers remove and test all polyps. CAUSES  Polyps form when mutations in the genes cause your cells to grow and divide even though no more tissue is needed. RISK FACTORS There are a number of risk factors that can increase your chances of getting colon polyps. They include:  Being older than 50 years.  Family history of colon polyps or colon cancer.  Long-term colon diseases, such as colitis or Crohn disease.  Being overweight.  Smoking.  Being inactive.  Drinking too much alcohol. SYMPTOMS  Most small polyps do not cause symptoms. If symptoms are present, they may include:  Blood in the stool. The stool may look dark red or black.  Constipation or diarrhea that lasts longer than 1 week. DIAGNOSIS People often do not know they have polyps until their caregiver finds them during a regular checkup. Your caregiver can use 4 tests to check for polyps:  Digital rectal exam. The caregiver wears gloves and feels inside the rectum. This test would find polyps only in the rectum.  Barium enema. The caregiver puts a liquid called barium into your rectum before taking X-rays of your colon. Barium makes your colon look white. Polyps are dark, so they  are easy to see in the X-ray pictures.  Sigmoidoscopy. A thin, flexible tube (sigmoidoscope) is placed into your rectum. The sigmoidoscope has a light and tiny camera in it. The caregiver uses the sigmoidoscope to look at the last third of your colon.  Colonoscopy. This test is like sigmoidoscopy, but the caregiver looks at the entire colon. This is the most common method for finding and removing polyps. TREATMENT  Any polyps will be removed during a sigmoidoscopy or colonoscopy. The polyps are then tested for cancer. PREVENTION  To help lower your risk of getting more colon polyps:  Eat plenty of fruits and vegetables. Avoid eating fatty foods.  Do not smoke.  Avoid drinking alcohol.  Exercise every day.  Lose weight if recommended by your caregiver.  Eat plenty of calcium and folate. Foods that are rich in calcium include milk, cheese, and broccoli. Foods that are rich in folate include chickpeas, kidney beans, and spinach. HOME CARE INSTRUCTIONS Keep all follow-up appointments as directed by your caregiver. You may need periodic exams to check for polyps. SEEK MEDICAL CARE IF:  You notice bleeding during a bowel movement. Document Released: 03/23/2004 Document Revised: 09/19/2011 Document Reviewed: 09/06/2011 [facility] Patient Information 2015 [place], [place]. This information is not intended to replace advice given to you by your health care provider. Make sure you discuss any questions you have with your health care provider.   Gastroesophageal Reflux Disease, Adult Gastroesophageal reflux disease (GERD) happens when acid from your stomach flows up into the esophagus. When acid comes in contact with the esophagus, the acid causes soreness (inflammation) in the esophagus. Over time, GERD may create small holes (ulcers) in the lining of the esophagus. CAUSES   Increased body weight. This puts pressure on the stomach, making acid rise from the stomach into the esophagus.  Smoking.  This increases acid production in the stomach.  Drinking alcohol. This causes decreased pressure in the lower esophageal sphincter (valve or ring of muscle between the esophagus and stomach), allowing acid from the stomach into the esophagus.  Late evening meals and a full stomach. This increases pressure and acid production in the stomach.  A malformed lower esophageal sphincter. Sometimes, no cause is found. SYMPTOMS   Burning pain in the lower part of the mid-chest behind the breastbone and in the mid-stomach area. This may occur twice a week or more often.  Trouble swallowing.  Sore throat.  Dry cough.  Asthma-like symptoms including chest tightness, shortness of breath, or wheezing. DIAGNOSIS  Your caregiver may be able to diagnose GERD based on your symptoms. In some cases, X-rays and other tests may be done to check for complications or to check the condition of your stomach and esophagus. TREATMENT  Your caregiver may recommend over-the-counter or prescription medicines to help decrease acid production. Ask your caregiver before starting or adding any new medicines.  HOME CARE INSTRUCTIONS   Change the factors that you can control. Ask your caregiver for guidance concerning weight loss, quitting smoking, and alcohol consumption.  Avoid foods and drinks that make your symptoms worse, such as:  Caffeine or alcoholic drinks.  Chocolate.  Peppermint or mint flavorings.  Garlic and onions.  Spicy foods.  Citrus fruits, such as oranges, lemons, or limes.  Tomato-based foods such as sauce, chili, salsa, and pizza.  Fried and fatty foods.  Avoid lying down for the 3 hours prior to your bedtime or prior to taking a nap.  Eat small, frequent meals instead of large meals.  Wear loose-fitting clothing. Do not wear anything tight around your waist that causes pressure on your stomach.  Raise the head of your bed 6 to 8 inches with wood blocks to help you sleep. Extra  pillows will not help.  Only take over-the-counter or prescription medicines for pain, discomfort, or fever as directed by your caregiver.  Do not take aspirin, ibuprofen, or other nonsteroidal anti-inflammatory drugs (NSAIDs). SEEK IMMEDIATE MEDICAL CARE IF:   You have pain in your arms, neck, jaw, teeth, or back.  Your pain increases or changes in intensity or duration.  You develop nausea, vomiting, or sweating (diaphoresis).  You develop shortness of breath, or you faint.  Your vomit is green, yellow, black, or looks like coffee grounds or blood.  Your stool is red, bloody, or black. These symptoms could be signs of other problems, such as heart disease, gastric bleeding, or esophageal bleeding. MAKE SURE YOU:   Understand these instructions.  Will watch your condition.  Will get help right away if you are not doing well or get worse. Document Released: 04/06/2005 Document Revised: 09/19/2011  Document Reviewed: 01/14/2011 [facility] Patient Information 2015 [place]. This information is not intended to replace advice given to you by your health care provider. Make sure you discuss any questions you have with your health care provider.

## 2014-08-27 NOTE — Op Note (Signed)
[place], [address]   ENDOSCOPY PROCEDURE REPORT  PATIENT: Ryan, [name]  MR#: [id number] BIRTHDATE: 03-05-1956 , 39  yrs. old GENDER: male ENDOSCOPIST: R.  Garfield Cornea, MD FACP FACG REFERRED BY:  Asencion Noble, M.D. PROCEDURE DATE:  Aug 29, 2014 PROCEDURE:  EGD w/ biopsy INDICATIONS:  iron deficiency anemia.; Hemoccult-positive stool MEDICATIONS: Versed 7 mg IV and Demerol 75 mg IV in divided doses. Xylocaine gel orally.  Zofran 4 mg IV. ASA CLASS:      Class II  CONSENT: The risks, benefits, limitations, alternatives and imponderables have been discussed.  The potential for biopsy, esophogeal dilation, etc. have also been reviewed.  Questions have been answered.  All parties agreeable.  Please see the history and physical in the medical record for more information.  DESCRIPTION OF PROCEDURE: After the risks benefits and alternatives of the procedure were thoroughly explained, informed consent was obtained.  The EG-2990i ([id number]) endoscope was introduced through the mouth and advanced to the second portion of the duodenum , limited by Without limitations. The instrument was slowly withdrawn as the mucosa was fully examined.    Couple of tiny distal esophageal erosions.  No Barrett's esophagus. Tubular esophagus otherwise appeared normal.  Stomach empty.  Small hiatal hernia.  Multiple antral erosions.  No ulcer or infiltrating process.  Patent pylorus.  Normal appearing first, second and third portion of the duodenum.  Retroflexed views revealed as previously described.    Biopsies of abnormal antrum taken for histologic study. The scope was then withdrawn from the patient and the procedure completed.  COMPLICATIONS: There were no immediate complications.  ENDOSCOPIC IMPRESSION: Tiny distal esophageal erosions consistent with mild erosive reflux esophagitis. Hiatal hernia. Antral erosions?"status post  gastric biopsy.  RECOMMENDATIONS: Follow-up on pathology. See colonoscopy.  REPEAT EXAM:  eSigned:  R. Garfield Cornea, MD Rosalita Chessman [facility] 29-Aug-2014 1:50 PM    CC:  CPT CODES: ICD CODES:  The ICD and CPT codes recommended by this software are interpretations from the data that the clinical staff has captured with the software.  The verification of the translation of this report to the ICD and CPT codes and modifiers is the sole responsibility of the health care institution and practicing physician where this report was generated.  [place]. will not be held responsible for the validity of the ICD and CPT codes included on this report.  AMA assumes no liability for data contained or not contained herein. CPT is a Designer, television/film set of the [employer].  PATIENT NAME:  Ryan, [name] MR#: [id number]

## 2014-08-27 NOTE — Interval H&P Note (Signed)
History and Physical Interval Note:  08/27/2014 1:13 PM  Ryan [name]  has presented today for surgery, with the diagnosis of Iron Deficiency Anemia  The various methods of treatment have been discussed with the patient and family. After consideration of risks, benefits and other options for treatment, the patient has consented to  Procedure(s) with comments: COLONOSCOPY (N/A) - 1230pm ESOPHAGOGASTRODUODENOSCOPY (EGD) (N/A) as a surgical intervention .  The patient's history has been reviewed, patient examined, no change in status, stable for surgery.  I have reviewed the patient's chart and labs.  Questions were answered to the patient's satisfaction.     [name]  No change. EGD and colonoscopy per plan.  The risks, benefits, limitations, imponderables and alternatives regarding both EGD and colonoscopy have been reviewed with the patient. Questions have been answered. All parties agreeable.

## 2014-08-27 NOTE — Op Note (Addendum)
[place], [address]   COLONOSCOPY PROCEDURE REPORT  PATIENT: Ryan [name], Ryan [name]  MR#: [id number] BIRTHDATE: [birth year] , 59  yrs. old GENDER: male ENDOSCOPIST: R.  Garfield Cornea, MD FACP [facility] REFERRED BY:Roy Willey Blade, M.D. PROCEDURE DATE:  09/05/2014 PROCEDURE:   Ileo-colonoscopy with snare polypectomy, Colonoscopy with ablation, and Colonoscopy with biopsy INDICATIONS:iron deficiency anemia; Hemoccult positive stool (per patient report). MEDICATIONS: Versed 12 mg IV and Demerol 125 mg IV in divided doses. Zofran 4 mg IV. ASA CLASS:       Class II  CONSENT: The risks, benefits, alternatives and imponderables including but not limited to bleeding, perforation as well as the possibility of a missed lesion have been reviewed.  The potential for biopsy, lesion removal, etc. have also been discussed. Questions have been answered.  All parties agreeable.  Please see the history and physical in the medical record for more information.  DESCRIPTION OF PROCEDURE:   After the risks benefits and alternatives of the procedure were thoroughly explained, informed consent was obtained.  The digital rectal exam      The EG-2990i ([id number])  endoscope was introduced through the anus and advanced to the terminal ileum which was intubated for a short distance. No adverse events experienced.   The quality of the prep was adequate The instrument was then slowly withdrawn as the colon was fully examined.      COLON FINDINGS: (1) 5 mm polyp in the distal rectum seen best retroflexed; otherwise, remainder the rectal mucosa appeared normal.  Somewhat redundant colon.  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  The distal 5 cm of terminal  ileum mucosa appeared normal.  The above-mentioned polyps were hot snare removed  x 2 and cold biopsy removed, respectively.  The AVMs were sealed with multiple applications of the Erbie unit utilizing the circular probe on right colon setting.  Retroflexion was performed. .  Withdrawal time=23 minutes 0 seconds.  The scope was withdrawn and the procedure completed. COMPLICATIONS: There were no immediate complications.  ENDOSCOPIC IMPRESSION: Multiple rectal and colonic polyps removed as described above. 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 Colonic diverticulosis.  RECOMMENDATIONS: Follow-up pathology. See EGD report.  eSigned:  R. Garfield Cornea, MD Rosalita Chessman [facility] September 05, 2014 2:38 PM Revised: 09/05/2014 2:38 PM  cc:  CPT CODES: ICD CODES:  The ICD and CPT codes recommended by this software are interpretations from the data that the clinical staff has captured with the software.  The verification of the translation of this report to the ICD and CPT codes and modifiers is the sole responsibility of the health care institution and practicing physician where this report was generated.  [place]. will not be held responsible for the validity of the ICD and CPT codes included on this report.  AMA assumes no liability for data contained or not contained herein. CPT is a Designer, television/film set of the [employer].  PATIENT NAME:  Ryan [name], Ryan [name] MR#: [id number]

## 2014-08-27 NOTE — H&P (View-Only) (Signed)
Referring Provider: Asencion Noble, MD Primary Care Physician:  Asencion Noble, MD Primary Gastroenterologist:  Dr. Gala Romney   Chief Complaint  Patient presents with  . Anemia    HPI:   Ryan [name] is a 59 y.o. male presenting today at the request of Asencion Noble, MD secondary to [place]. Last colonoscopy in 2006 with hyperplastic polyps. Recent Hgb 12.4, with ferritin 7, iron low normal at 46.    No hematochezia or melena. No changes in bowel habits. No abdominal pain. No unexplained weight loss or lack of appetite. No reflux or dysphagia. Denies significant fatigue. meloxicam 1/2 tab every other day.   Purposeful weight loss of 75 lbs about 4 years ago, coming off hypertensive and cholesterol agents.     Past Medical History  Diagnosis Date  . Hypertension     diet controlled  . COPD (chronic obstructive pulmonary disease)   . High cholesterol     diet controlled  . Arthritis   . Asthma   . Sleep apnea     CPAP    Past Surgical History  Procedure Laterality Date  . Cholecystectomy    . Colonoscopy  03/15/2005    Dr. Rourk:Rectal polyps (diminutive), status post cold biopsy/removal/normal colon. hyperplastic    Current Outpatient Prescriptions  Medication Sig Dispense Refill  . acetaminophen (TYLENOL) 500 MG tablet Take by mouth.    . fluticasone-salmeterol (ADVAIR HFA) 115-21 MCG/ACT inhaler Inhale into the lungs.    . gabapentin (NEURONTIN) 100 MG capsule Take by mouth.    . lansoprazole (PREVACID SOLUTAB) 15 MG disintegrating tablet Take by mouth.    . levalbuterol (XOPENEX HFA) 45 MCG/ACT inhaler Inhale into the lungs.    . meloxicam (MOBIC) 15 MG tablet Take by mouth.    . mirtazapine (REMERON) 15 MG tablet Take by mouth.    . mometasone (NASONEX) 50 MCG/ACT nasal spray     . traMADol (ULTRAM) 50 MG tablet Take by mouth.    . zolpidem (AMBIEN) 10 MG tablet Take by mouth.    . peg 3350 powder (MOVIPREP) 100 G SOLR Take 1 kit (200 g total) by mouth once. 1 kit 0   No  current facility-administered medications for this visit.    Allergies as of 08/08/2014 - Review Complete 12/02/2011  Allergen Reaction Noted  . Latex  11/30/2010    Family History  Problem Relation Age of Onset  . Colon cancer Neg Hx   . Cancer Mother     unsure primary    History   Social History  . Marital Status: Married    Spouse Name: N/A    Number of Children: N/A  . Years of Education: N/A   Occupational History  . [employer]    Social History Main Topics  . Smoking status: Never Smoker   . Smokeless tobacco: Not on file  . Alcohol Use: 0.0 oz/week    0 Not specified per week     Comment: couple of drinks of borboun every night  . Drug Use: No  . Sexual Activity: Not on file   Other Topics Concern  . Not on file   Social History Narrative    Review of Systems: Gen: Denies any fever, chills, loss of appetite, fatigue, weight loss. CV: Denies chest pain, heart palpitations, syncope, peripheral edema. Resp: Denies shortness of breath with rest, cough, wheezing GI: see HPI GU : Denies urinary burning, urinary frequency, urinary incontinence.  MS: Denies joint pain, muscle weakness, cramps, limited  movement Derm: Denies rash, itching, dry skin Psych: Denies depression, anxiety, confusion or memory loss  Heme: Denies bruising, bleeding, and enlarged lymph nodes.  Physical Exam: BP 143/84 mmHg  Pulse 86  Temp(Src) 98.1 F (36.7 C) (Oral)  Ht $R'5\' 10"'xo$  (1.778 m)  Wt 215 lb 12.8 oz (97.886 kg)  BMI 30.96 kg/m2 General:   Alert and oriented. Well-developed, well-nourished, pleasant and cooperative. Head:  Normocephalic and atraumatic. Eyes:  Conjunctiva pink, sclera clear, no icterus.   Conjunctiva pink. Ears:  Normal auditory acuity. Nose:  No deformity, discharge,  or lesions. Mouth:  No deformity or lesions, mucosa pink and moist.  Lungs:  Clear to auscultation bilaterally, without wheezing, rales, or rhonchi.  Heart:  S1, S2 present  without murmurs noted.  Abdomen:  +BS, soft, non-tender and non-distended. Without mass or HSM. No rebound or guarding. No hernias noted. Rectal:  Deferred  Msk:  Symmetrical without gross deformities. Normal posture. Extremities:  Without edema. Neurologic:  Alert and  oriented x4;  grossly normal neurologically. Skin:  Intact, warm and dry without significant lesions or rashes Psych:  Alert and cooperative. Normal mood and affect.   Outside labs to be abstracted 06/2014:   Hgb 12.4, with ferritin 7, iron low normal at 46.

## 2014-08-29 ENCOUNTER — Encounter (HOSPITAL_COMMUNITY): Payer: Self-pay | Admitting: Internal Medicine

## 2014-09-04 ENCOUNTER — Telehealth: Payer: Self-pay

## 2014-09-04 ENCOUNTER — Encounter: Payer: Self-pay | Admitting: Internal Medicine

## 2014-09-04 DIAGNOSIS — D509 Iron deficiency anemia, unspecified: Secondary | ICD-10-CM

## 2014-09-04 NOTE — Progress Notes (Signed)
Patient ID: Ryan [name], male   DOB: [birth year], 59 y.o.   MRN: [id number] See letter for biopsy results. Patient needs an office visit with extender [place] in 4-6 weeks to reassess IDA

## 2014-09-04 NOTE — Progress Notes (Signed)
PATIENT SCHEDULED AND AWARE OF DATE AND TIME OF APPOINTMENT

## 2014-09-04 NOTE — Telephone Encounter (Signed)
Per RMR-  Send letter to patient.  Send copy of letter with path to referring provider and PCP.   Patient needs a CBC along with office visit with extender in about 4-6 weeks

## 2014-09-04 NOTE — Telephone Encounter (Signed)
Lab order done and letter mailed to the pt.

## 2014-09-04 NOTE — Progress Notes (Signed)
Please schedule ov.  

## 2014-09-04 NOTE — Telephone Encounter (Signed)
APPOINTMENT MADE AND PATIENT AWARE. °

## 2014-10-01 ENCOUNTER — Other Ambulatory Visit: Payer: Self-pay

## 2014-10-01 DIAGNOSIS — D509 Iron deficiency anemia, unspecified: Secondary | ICD-10-CM

## 2014-10-09 LAB — CBC WITH DIFFERENTIAL/PLATELET
BASOS ABS: 0 10*3/uL (ref 0.0–0.1)
BASOS PCT: 1 % (ref 0–1)
Eosinophils Absolute: 0.1 10*3/uL (ref 0.0–0.7)
Eosinophils Relative: 2 % (ref 0–5)
HEMATOCRIT: 38.7 % — AB (ref 39.0–52.0)
Hemoglobin: 12 g/dL — ABNORMAL LOW (ref 13.0–17.0)
Lymphocytes Relative: 38 % (ref 12–46)
Lymphs Abs: 1.6 10*3/uL (ref 0.7–4.0)
MCH: 26.1 pg (ref 26.0–34.0)
MCHC: 31 g/dL (ref 30.0–36.0)
MCV: 84.3 fL (ref 78.0–100.0)
MONOS PCT: 12 % (ref 3–12)
MPV: 9.9 fL (ref 8.6–12.4)
Monocytes Absolute: 0.5 10*3/uL (ref 0.1–1.0)
NEUTROS ABS: 2 10*3/uL (ref 1.7–7.7)
NEUTROS PCT: 47 % (ref 43–77)
PLATELETS: 271 10*3/uL (ref 150–400)
RBC: 4.59 MIL/uL (ref 4.22–5.81)
RDW: 15.1 % (ref 11.5–15.5)
WBC: 4.3 10*3/uL (ref 4.0–10.5)

## 2014-10-16 ENCOUNTER — Ambulatory Visit (INDEPENDENT_AMBULATORY_CARE_PROVIDER_SITE_OTHER): Payer: BLUE CROSS/BLUE SHIELD | Admitting: Gastroenterology

## 2014-10-16 ENCOUNTER — Encounter: Payer: Self-pay | Admitting: Gastroenterology

## 2014-10-16 VITALS — BP 145/83 | HR 74 | Temp 98.4°F | Ht 70.0 in | Wt 216.8 lb

## 2014-10-16 DIAGNOSIS — D509 Iron deficiency anemia, unspecified: Secondary | ICD-10-CM

## 2014-10-16 DIAGNOSIS — Z86010 Personal history of colonic polyps: Secondary | ICD-10-CM | POA: Diagnosis not present

## 2014-10-16 NOTE — Patient Instructions (Signed)
We have scheduled you for a capsule study to rule out any causes of iron deficiency that could be resulting from somewhere in your small intestine.   I will verify with Dr. Gala Romney the timing for the next colonoscopy and send you a note in your chart.

## 2014-10-20 ENCOUNTER — Telehealth: Payer: Self-pay | Admitting: General Practice

## 2014-10-20 NOTE — Telephone Encounter (Signed)
Per Julian Hy with [facility] states this study requires a peer to peer review.  [id number]  4 hour window for call back from md with [facility] is 5pm

## 2014-10-20 NOTE — Telephone Encounter (Signed)
Noted  

## 2014-10-20 NOTE — Telephone Encounter (Signed)
Per Daija A. No authorization is required.  I asked if we could do a voluntary prior authorization to have on file.  Authorization#

## 2014-10-21 ENCOUNTER — Encounter: Payer: Self-pay | Admitting: Gastroenterology

## 2014-10-21 ENCOUNTER — Other Ambulatory Visit: Payer: Self-pay

## 2014-10-21 DIAGNOSIS — D509 Iron deficiency anemia, unspecified: Secondary | ICD-10-CM

## 2014-10-21 NOTE — Assessment & Plan Note (Addendum)
59 year old male with IDA in the setting of Mobic, with colonoscopy/EGD on file. Multiple colon polyps noted, and 3 non-bleeding AVMs noted. Would recommend capsule study to wrap up GI evaluation. Will need to start iron therapy once capsule study completed. Next colonoscopy in 3 years due to multiple polyps and sessile serrated polyp at 1cm.

## 2014-10-21 NOTE — Telephone Encounter (Signed)
APPROVED #: [id number]

## 2014-10-21 NOTE — Progress Notes (Signed)
cc'ed to pcp °

## 2014-10-21 NOTE — Telephone Encounter (Signed)
Called pt and he is scheduled for 11/05/2014 @ [facility].  Pt is aware

## 2014-10-21 NOTE — Progress Notes (Signed)
Referring Provider: Asencion Noble, MD Primary Care Physician:  Asencion Noble, MD  Primary GI: Dr. Gala Romney   Chief Complaint  Patient presents with  . Follow-up    HPI:   Ryan [name] is a 59 y.o. male presenting today with a history of IDA, ferritin 7 and iron low normal at 46. Repeat Hgb slightly lower at 12 recently. (previously 12.4). Underwent EGD and colonoscopy Feb 2016 as noted below. Here to discuss capsule study. Takes Mobic 1/2 tablet every other day but trying to taper off. No melena, hematochezia. Remains on Prevacid. Next colonoscopy due in 3 years. 1 cm sessile serrated polyp noted on colonoscopy, among multiple polyps. No adenomas. Prevacid not covered by insurance any longer, but he has a large amount at home to finish. Will call our office when he runs out and will need a different PPI.    Past Medical History  Diagnosis Date  . Hypertension     diet controlled  . COPD (chronic obstructive pulmonary disease)   . High cholesterol     diet controlled  . Arthritis   . Asthma   . Sleep apnea     CPAP    Past Surgical History  Procedure Laterality Date  . Cholecystectomy    . Colonoscopy  03/15/2005    Dr. Rourk:Rectal polyps (diminutive), status post cold biopsy/removal/normal colon. hyperplastic  . Colonoscopy N/A 08/27/2014    Dr. Gala Romney: Multiple rectal and colonic polyps removed, one which was a 1 cm sessile polyp. Cecal AVMS ablated as described above. colonic Diverticulosis. Path with rectal hyperplastic polyp, ascending colon polyps with benign colonic mucosa, sessile serrated polyp with associated lipoma. Recommendation for surveillance colonoscopy in 3 years.   . Esophagogastroduodenoscopy N/A 08/27/2014    Dr. Rourk:Tiny distal esophageal erosions consistent with mild erosive reflux esophaigitis. Hiatal hernia. Antral erosions status post gastric biopsy. mild chronic gastritis, negative H.pylori    Current Outpatient Prescriptions  Medication Sig  Dispense Refill  . acetaminophen (TYLENOL) 500 MG tablet Take 500 mg by mouth 3 (three) times daily.     . fluticasone-salmeterol (ADVAIR HFA) 115-21 MCG/ACT inhaler Inhale 1-2 puffs into the lungs 2 (two) times daily. 2 puffs in the morning and 1 puff in the evening.    . gabapentin (NEURONTIN) 100 MG capsule Take 100 mg by mouth 3 (three) times daily.     . lansoprazole (PREVACID SOLUTAB) 15 MG disintegrating tablet Take 15 mg by mouth daily as needed (acid reflux).     Marland Kitchen levalbuterol (XOPENEX HFA) 45 MCG/ACT inhaler Inhale 1-2 puffs into the lungs every 6 (six) hours as needed for wheezing or shortness of breath.     . meloxicam (MOBIC) 15 MG tablet Take 7.5 mg by mouth every other day.     . mirtazapine (REMERON) 15 MG tablet Take 15 mg by mouth at bedtime.     . mometasone (NASONEX) 50 MCG/ACT nasal spray Place 2 sprays into the nose daily as needed (sinus congestion).     . traMADol (ULTRAM) 50 MG tablet Take 50 mg by mouth 3 (three) times daily.     Marland Kitchen zolpidem (AMBIEN) 10 MG tablet Take 10 mg by mouth at bedtime as needed for sleep.      No current facility-administered medications for this visit.    Allergies as of 10/16/2014 - Review Complete 08/27/2014  Allergen Reaction Noted  . Latex  11/30/2010  . Tiotropium bromide monohydrate Other (See Comments) 08/18/2014    Family  History  Problem Relation Age of Onset  . Colon cancer Neg Hx   . Cancer Mother     unsure primary    History   Social History  . Marital Status: Married    Spouse Name: N/A  . Number of Children: N/A  . Years of Education: N/A   Occupational History  . [employer]    Social History Main Topics  . Smoking status: Former Smoker    Quit date: 10/16/2010  . Smokeless tobacco: Not on file  . Alcohol Use: 0.0 oz/week    0 Standard drinks or equivalent per week     Comment: couple of drinks of borboun every night  . Drug Use: No  . Sexual Activity: Not on file   Other Topics Concern    . None   Social History Narrative    Review of Systems: As mentioned in HPI  Physical Exam: BP 145/83 mmHg  Pulse 74  Temp(Src) 98.4 F (36.9 C) (Oral)  Ht 5\' 10"  (1.778 m)  Wt 216 lb 12.8 oz (98.34 kg)  BMI 31.11 kg/m2 General:   Alert and oriented. No distress noted. Pleasant and cooperative.  Head:  Normocephalic and atraumatic. Eyes:  Conjuctiva clear without scleral icterus. Mouth:  Oral mucosa pink and moist. Good dentition. No lesions. Heart:  S1, S2 present without murmurs, rubs, or gallops. Regular rate and rhythm. Abdomen:  +BS, soft, non-tender and non-distended. No rebound or guarding. No HSM or masses noted. Msk:  Symmetrical without gross deformities. Normal posture. Extremities:  Without edema. Neurologic:  Alert and  oriented x4;  grossly normal neurologically. Skin:  Intact without significant lesions or rashes. Psych:  Alert and cooperative. Normal mood and affect.  Lab Results  Component Value Date   WBC 4.3 10/09/2014   HGB 12.0* 10/09/2014   HCT 38.7* 10/09/2014   MCV 84.3 10/09/2014   PLT 271 10/09/2014

## 2014-10-21 NOTE — Telephone Encounter (Signed)
Instructions mailed.

## 2014-10-27 ENCOUNTER — Telehealth: Payer: Self-pay

## 2014-10-27 NOTE — Telephone Encounter (Signed)
Pt is calling because he receive a letter from the insurance company stated that RMR was not in net work. I told him that we would look into this for him and get bad with him before the end of the week.

## 2014-10-30 NOTE — Telephone Encounter (Signed)
I talked with Toyana at [facility] and she was able to find [facility] with in network. They still can not find RMR. She was going to search the [facility] system. She said that she would call us back Friday. Pt's wife is aware of what id going on and also stated that if they can not find RMR in network then he was going to cancel the [place] on 11/05/14.

## 2014-10-30 NOTE — Telephone Encounter (Signed)
[employer] called by approved RMR in network. Same PA number. Pt's wife is aware.

## 2014-11-05 ENCOUNTER — Encounter (HOSPITAL_COMMUNITY)
Admission: RE | Disposition: A | Discharge status: Home / Self Care | Payer: Self-pay | Source: Ambulatory Visit | Attending: Internal Medicine

## 2014-11-05 ENCOUNTER — Ambulatory Visit (HOSPITAL_COMMUNITY)
Admission: RE | Admit: 2014-11-05 | Discharge: 2014-11-05 | Disposition: A | Discharge status: Home / Self Care | Payer: BLUE CROSS/BLUE SHIELD | Source: Ambulatory Visit | Attending: Internal Medicine | Admitting: Internal Medicine

## 2014-11-05 DIAGNOSIS — D509 Iron deficiency anemia, unspecified: Secondary | ICD-10-CM | POA: Diagnosis not present

## 2014-11-05 HISTORY — PX: GIVENS CAPSULE STUDY: SHX5432

## 2014-11-05 SURGERY — IMAGING PROCEDURE, GI TRACT, INTRALUMINAL, VIA CAPSULE

## 2014-11-06 ENCOUNTER — Encounter (HOSPITAL_COMMUNITY): Payer: Self-pay | Admitting: Internal Medicine

## 2014-11-14 ENCOUNTER — Telehealth: Payer: Self-pay | Admitting: Gastroenterology

## 2014-11-14 DIAGNOSIS — D509 Iron deficiency anemia, unspecified: Secondary | ICD-10-CM

## 2014-11-14 NOTE — Telephone Encounter (Signed)
Reviewed with Dr. Gala Romney. Area is benign. Could be a lipoma or something similar. Nothing that needs further work-up.

## 2014-11-14 NOTE — Procedures (Signed)
Small Bowel [place] Capsule Study Procedure date:  11/05/14  Referring Provider:  Orvil Feil, ANP/Dr. Gala Romney  PCP:  Dr. Asencion Noble, MD  Indication for procedure:   59 year old male with IDA, ferritin initially low at 7 and iron low normal at 46, with 1 cm sessile serrated polyp noted on colonoscopy and multiple polyps. Cecal AVMs, non-bleeding Needs surveillance in 3 years. EGD with mild erosive reflux esophagitis, antral erosions, negative H.pylori. In setting of Mobic and IDA, capsule study now indicated to evaluate for occult small bowel etiology.     Findings:   Capsule study was complete to the cecum. No obvious mass, bleeding, AVMs noted. Scattered superficial erosions, possible mild ulcerations noted throughout small bowel. At 00:12:53 was incidentally noted a small, possibly raised area that may be a benign variant, possible submucosal entity, doubt extrinsic compression.   First Gastric image:  00:00:18 First Duodenal image: 00:04:37 First Cecal image: 1:36:05 Gastric Passage time: 0h 51m Small Bowel Passage time:  1h 29m  Summary & Recommendations: IDA with thorough work-up to include colonoscopy, EGD, and capsule study, without occult small bowel tumor or overt GI bleeding noted. With cecal AVMs noted on colonoscopy, could have occult blood loss in setting of Mobic; erosions and possible mild ulcerations also noted in small bowel in setting of Mobic, contributing to overall IDA. Will start iron 325 mg po BID, recheck CBC, iron, and ferritin in 6 weeks. Consider referral to hematology after repeat blood work.   As of note, area of interest at 00:12:53 likely non-specific.   Orvil Feil, ANP-BC [facility]     Attending note:  Pertinent images reviewed. Patient likely has mild NSAID insult to the small intestine. No evidence of tumor or infiltrating process.

## 2014-11-14 NOTE — Telephone Encounter (Signed)
Capsule study completed, with report as follows:  IDA with thorough work-up to include colonoscopy, EGD, and capsule study, without occult small bowel tumor or overt GI bleeding noted. With cecal AVMs noted on colonoscopy, could have occult blood loss in setting of Mobic; erosions and possible mild ulcerations also noted in small bowel in setting of Mobic, contributing to overall IDA. Pleas have patient start iron 325 mg po BID, recheck CBC, iron, and ferritin in 6 weeks. Consider referral to hematology after repeat blood work.   As of note, area of interest at 00:12:53 likely non-specific but will review further with attending physician. Appears to be a small raised area but not overall concerning appearing. Incidentally noted. Please let patient know. Thanks!

## 2014-11-18 ENCOUNTER — Other Ambulatory Visit: Payer: Self-pay | Admitting: Gastroenterology

## 2014-11-18 DIAGNOSIS — D509 Iron deficiency anemia, unspecified: Secondary | ICD-10-CM

## 2014-11-18 NOTE — Telephone Encounter (Signed)
Pt is aware. Lab order on file. 

## 2014-12-01 ENCOUNTER — Other Ambulatory Visit: Payer: Self-pay

## 2014-12-01 DIAGNOSIS — D509 Iron deficiency anemia, unspecified: Secondary | ICD-10-CM

## 2014-12-31 LAB — CBC WITH DIFFERENTIAL/PLATELET
BASOS PCT: 0 % (ref 0–1)
Basophils Absolute: 0 10*3/uL (ref 0.0–0.1)
EOS ABS: 0.2 10*3/uL (ref 0.0–0.7)
Eosinophils Relative: 3 % (ref 0–5)
HEMATOCRIT: 42.9 % (ref 39.0–52.0)
HEMOGLOBIN: 14.2 g/dL (ref 13.0–17.0)
Lymphocytes Relative: 37 % (ref 12–46)
Lymphs Abs: 2.2 10*3/uL (ref 0.7–4.0)
MCH: 29.4 pg (ref 26.0–34.0)
MCHC: 33.1 g/dL (ref 30.0–36.0)
MCV: 88.8 fL (ref 78.0–100.0)
MONO ABS: 0.4 10*3/uL (ref 0.1–1.0)
MONOS PCT: 7 % (ref 3–12)
MPV: 10.1 fL (ref 8.6–12.4)
Neutro Abs: 3.1 10*3/uL (ref 1.7–7.7)
Neutrophils Relative %: 53 % (ref 43–77)
Platelets: 226 10*3/uL (ref 150–400)
RBC: 4.83 MIL/uL (ref 4.22–5.81)
RDW: 17.1 % — AB (ref 11.5–15.5)
WBC: 5.9 10*3/uL (ref 4.0–10.5)

## 2014-12-31 LAB — FERRITIN: Ferritin: 27 ng/mL (ref 22–322)

## 2014-12-31 LAB — IRON: Iron: 154 ug/dL (ref 42–165)

## 2015-01-14 ENCOUNTER — Other Ambulatory Visit: Payer: Self-pay | Admitting: Internal Medicine

## 2015-01-14 DIAGNOSIS — D509 Iron deficiency anemia, unspecified: Secondary | ICD-10-CM

## 2015-01-14 NOTE — Progress Notes (Signed)
TCs nic.

## 2015-01-16 ENCOUNTER — Encounter: Payer: Self-pay | Admitting: Gastroenterology

## 2015-02-16 NOTE — Patient Instructions (Signed)
Your procedure is scheduled on: 02/23/2015  Report to [facility] at  900   AM.  Call this number if you have problems the morning of surgery: [phone]   Do not eat food or drink liquids :After Midnight.      Take these medicines the morning of surgery with A SIP OF WATER: Neurontin, prevacid, ultram. Take your inhalers before you come.   Do not wear jewelry, make-up or nail polish.  Do not wear lotions, powders, or perfumes.   Do not shave 48 hours prior to surgery.  Do not bring valuables to the hospital.  Contacts, dentures or bridgework may not be worn into surgery.  Leave suitcase in the car. After surgery it may be brought to your room.  For patients admitted to the hospital, checkout time is 11:00 AM the day of discharge.   Patients discharged the day of surgery will not be allowed to drive home.  :     Please read over the following fact sheets that you were given: Coughing and Deep Breathing, Surgical Site Infection Prevention, Anesthesia Post-op Instructions and Care and Recovery After Surgery    Cataract A cataract is a clouding of the lens of the eye. When a lens becomes cloudy, vision is reduced based on the degree and nature of the clouding. Many cataracts reduce vision to some degree. Some cataracts make people more near-sighted as they develop. Other cataracts increase glare. Cataracts that are ignored and become worse can sometimes look white. The white color can be seen through the pupil. CAUSES   Aging. However, cataracts may occur at any age, even in newborns.   Certain drugs.   Trauma to the eye.   Certain diseases such as diabetes.   Specific eye diseases such as chronic inflammation inside the eye or a sudden attack of a rare form of glaucoma.   Inherited or acquired medical problems.  SYMPTOMS   Gradual, progressive drop in vision in the affected eye.   Severe, rapid visual loss. This most often happens when trauma is the cause.  DIAGNOSIS  To detect  a cataract, an eye doctor examines the lens. Cataracts are best diagnosed with an exam of the eyes with the pupils enlarged (dilated) by drops.  TREATMENT  For an early cataract, vision may improve by using different eyeglasses or stronger lighting. If that does not help your vision, surgery is the only effective treatment. A cataract needs to be surgically removed when vision loss interferes with your everyday activities, such as driving, reading, or watching TV. A cataract may also have to be removed if it prevents examination or treatment of another eye problem. Surgery removes the cloudy lens and usually replaces it with a substitute lens (intraocular lens, IOL).  At a time when both you and your doctor agree, the cataract will be surgically removed. If you have cataracts in both eyes, only one is usually removed at a time. This allows the operated eye to heal and be out of danger from any possible problems after surgery (such as infection or poor wound healing). In rare cases, a cataract may be doing damage to your eye. In these cases, your caregiver may advise surgical removal right away. The vast majority of people who have cataract surgery have better vision afterward. HOME CARE INSTRUCTIONS  If you are not planning surgery, you may be asked to do the following:  Use different eyeglasses.   Use stronger or brighter lighting.   Ask your eye doctor  about reducing your medicine dose or changing medicines if it is thought that a medicine caused your cataract. Changing medicines does not make the cataract go away on its own.   Become familiar with your surroundings. Poor vision can lead to injury. Avoid bumping into things on the affected side. You are at a higher risk for tripping or falling.   Exercise extreme care when driving or operating machinery.   Wear sunglasses if you are sensitive to bright light or experiencing problems with glare.  SEEK IMMEDIATE MEDICAL CARE IF:   You have a  worsening or sudden vision loss.   You notice redness, swelling, or increasing pain in the eye.   You have a fever.  Document Released: 06/27/2005 Document Revised: 06/16/2011 Document Reviewed: 02/18/2011 [facility] Patient Information 2012 [place].PATIENT INSTRUCTIONS POST-ANESTHESIA  IMMEDIATELY FOLLOWING SURGERY:  Do not drive or operate machinery for the first twenty four hours after surgery.  Do not make any important decisions for twenty four hours after surgery or while taking narcotic pain medications or sedatives.  If you develop intractable nausea and vomiting or a severe headache please notify your doctor immediately.  FOLLOW-UP:  Please make an appointment with your surgeon as instructed. You do not need to follow up with anesthesia unless specifically instructed to do so.  WOUND CARE INSTRUCTIONS (if applicable):  Keep a dry clean dressing on the anesthesia/puncture wound site if there is drainage.  Once the wound has quit draining you may leave it open to air.  Generally you should leave the bandage intact for twenty four hours unless there is drainage.  If the epidural site drains for more than 36-48 hours please call the anesthesia department.  QUESTIONS?:  Please feel free to call your physician or the hospital operator if you have any questions, and they will be happy to assist you.

## 2015-02-17 ENCOUNTER — Other Ambulatory Visit: Payer: Self-pay

## 2015-02-17 ENCOUNTER — Encounter (HOSPITAL_COMMUNITY): Payer: Self-pay

## 2015-02-17 ENCOUNTER — Encounter (HOSPITAL_COMMUNITY)
Admission: RE | Admit: 2015-02-17 | Discharge: 2015-02-17 | Disposition: A | Discharge status: Home / Self Care | Payer: BLUE CROSS/BLUE SHIELD | Source: Ambulatory Visit | Attending: Ophthalmology | Admitting: Ophthalmology

## 2015-02-17 DIAGNOSIS — Z01818 Encounter for other preprocedural examination: Secondary | ICD-10-CM | POA: Diagnosis present

## 2015-02-17 DIAGNOSIS — H2511 Age-related nuclear cataract, right eye: Secondary | ICD-10-CM | POA: Insufficient documentation

## 2015-02-17 HISTORY — DX: Major depressive disorder, single episode, unspecified: F32.9

## 2015-02-17 HISTORY — DX: Depression, unspecified: F32.A

## 2015-02-17 HISTORY — DX: Anemia, unspecified: D64.9

## 2015-02-17 HISTORY — DX: Gastro-esophageal reflux disease without esophagitis: K21.9

## 2015-02-17 LAB — BASIC METABOLIC PANEL
ANION GAP: 7 (ref 5–15)
BUN: 12 mg/dL (ref 6–20)
CO2: 28 mmol/L (ref 22–32)
Calcium: 9.5 mg/dL (ref 8.9–10.3)
Chloride: 104 mmol/L (ref 101–111)
Creatinine, Ser: 0.88 mg/dL (ref 0.61–1.24)
GFR calc Af Amer: >60 mL/min (ref >60)
GFR calc non Af Amer: >60 mL/min (ref >60)
Glucose, Bld: 89 mg/dL (ref 65–99)
POTASSIUM: 4.4 mmol/L (ref 3.5–5.1)
Sodium: 139 mmol/L (ref 135–145)

## 2015-02-17 LAB — CBC WITH DIFFERENTIAL/PLATELET
BASOS ABS: 0 10*3/uL (ref 0.0–0.1)
Basophils Relative: 0 % (ref 0–1)
EOS PCT: 3 % (ref 0–5)
Eosinophils Absolute: 0.2 10*3/uL (ref 0.0–0.7)
HCT: 45.2 % (ref 39.0–52.0)
HEMOGLOBIN: 14.7 g/dL (ref 13.0–17.0)
LYMPHS ABS: 2.1 10*3/uL (ref 0.7–4.0)
LYMPHS PCT: 31 % (ref 12–46)
MCH: 30.8 pg (ref 26.0–34.0)
MCHC: 32.5 g/dL (ref 30.0–36.0)
MCV: 94.8 fL (ref 78.0–100.0)
MONO ABS: 0.4 10*3/uL (ref 0.1–1.0)
Monocytes Relative: 6 % (ref 3–12)
Neutro Abs: 4.1 10*3/uL (ref 1.7–7.7)
Neutrophils Relative %: 60 % (ref 43–77)
Platelets: 204 10*3/uL (ref 150–400)
RBC: 4.77 MIL/uL (ref 4.22–5.81)
RDW: 13.6 % (ref 11.5–15.5)
WBC: 6.8 10*3/uL (ref 4.0–10.5)

## 2015-02-20 MED ORDER — LIDOCAINE HCL 3.5 % OP GEL
OPHTHALMIC | Status: AC
  Filled 2015-02-20: qty 1

## 2015-02-20 MED ORDER — TETRACAINE HCL 0.5 % OP SOLN
OPHTHALMIC | Status: AC
  Filled 2015-02-20: qty 2

## 2015-02-20 MED ORDER — CYCLOPENTOLATE-PHENYLEPHRINE OP SOLN OPTIME - NO CHARGE
OPHTHALMIC | Status: AC
  Filled 2015-02-20: qty 2

## 2015-02-23 ENCOUNTER — Ambulatory Visit (HOSPITAL_COMMUNITY): Payer: BLUE CROSS/BLUE SHIELD | Admitting: Anesthesiology

## 2015-02-23 ENCOUNTER — Encounter (HOSPITAL_COMMUNITY): Payer: Self-pay | Admitting: *Deleted

## 2015-02-23 ENCOUNTER — Encounter (HOSPITAL_COMMUNITY)
Admission: RE | Disposition: A | Discharge status: Home / Self Care | Payer: Self-pay | Source: Ambulatory Visit | Attending: Ophthalmology

## 2015-02-23 ENCOUNTER — Ambulatory Visit (HOSPITAL_COMMUNITY)
Admission: RE | Admit: 2015-02-23 | Discharge: 2015-02-23 | Disposition: A | Discharge status: Home / Self Care | Payer: BLUE CROSS/BLUE SHIELD | Source: Ambulatory Visit | Attending: Ophthalmology | Admitting: Ophthalmology

## 2015-02-23 DIAGNOSIS — F329 Major depressive disorder, single episode, unspecified: Secondary | ICD-10-CM | POA: Insufficient documentation

## 2015-02-23 DIAGNOSIS — I10 Essential (primary) hypertension: Secondary | ICD-10-CM | POA: Diagnosis not present

## 2015-02-23 DIAGNOSIS — G4730 Sleep apnea, unspecified: Secondary | ICD-10-CM | POA: Diagnosis not present

## 2015-02-23 DIAGNOSIS — D649 Anemia, unspecified: Secondary | ICD-10-CM | POA: Insufficient documentation

## 2015-02-23 DIAGNOSIS — J449 Chronic obstructive pulmonary disease, unspecified: Secondary | ICD-10-CM | POA: Insufficient documentation

## 2015-02-23 DIAGNOSIS — M1990 Unspecified osteoarthritis, unspecified site: Secondary | ICD-10-CM | POA: Diagnosis not present

## 2015-02-23 DIAGNOSIS — Z91040 Latex allergy status: Secondary | ICD-10-CM | POA: Diagnosis not present

## 2015-02-23 DIAGNOSIS — H2511 Age-related nuclear cataract, right eye: Secondary | ICD-10-CM | POA: Diagnosis not present

## 2015-02-23 DIAGNOSIS — K219 Gastro-esophageal reflux disease without esophagitis: Secondary | ICD-10-CM | POA: Diagnosis not present

## 2015-02-23 DIAGNOSIS — J45909 Unspecified asthma, uncomplicated: Secondary | ICD-10-CM | POA: Insufficient documentation

## 2015-02-23 DIAGNOSIS — Z888 Allergy status to other drugs, medicaments and biological substances status: Secondary | ICD-10-CM | POA: Insufficient documentation

## 2015-02-23 HISTORY — PX: CATARACT EXTRACTION W/PHACO: SHX586

## 2015-02-23 SURGERY — PHACOEMULSIFICATION, CATARACT, WITH IOL INSERTION
Anesthesia: Monitor Anesthesia Care | Site: Eye | Laterality: Right

## 2015-02-23 MED ORDER — FENTANYL CITRATE (PF) 100 MCG/2ML IJ SOLN
25.0000 ug | INTRAMUSCULAR | Status: AC
  Administered 2015-02-23 (×2): 25 ug via INTRAVENOUS

## 2015-02-23 MED ORDER — PHENYLEPHRINE-KETOROLAC 1-0.3 % IO SOLN
INTRAOCULAR | Status: DC | PRN
  Administered 2015-02-23: 500 mL via OPHTHALMIC

## 2015-02-23 MED ORDER — TETRACAINE 0.5 % OP SOLN OPTIME - NO CHARGE
OPHTHALMIC | Status: DC | PRN
  Administered 2015-02-23: 1 [drp] via OPHTHALMIC

## 2015-02-23 MED ORDER — CYCLOPENTOLATE-PHENYLEPHRINE 0.2-1 % OP SOLN
1.0000 [drp] | OPHTHALMIC | Status: AC
  Administered 2015-02-23 (×3): 1 [drp] via OPHTHALMIC

## 2015-02-23 MED ORDER — LIDOCAINE HCL 3.5 % OP GEL: 1.0000 "application " | Freq: Once | OPHTHALMIC | Status: DC

## 2015-02-23 MED ORDER — MIDAZOLAM HCL 2 MG/2ML IJ SOLN
INTRAMUSCULAR | Status: AC
  Filled 2015-02-23: qty 2

## 2015-02-23 MED ORDER — BSS IO SOLN
INTRAOCULAR | Status: DC | PRN
  Administered 2015-02-23: 15 mL

## 2015-02-23 MED ORDER — LIDOCAINE HCL 3.5 % OP GEL
OPHTHALMIC | Status: DC | PRN
  Administered 2015-02-23: 1 via OPHTHALMIC

## 2015-02-23 MED ORDER — FENTANYL CITRATE (PF) 100 MCG/2ML IJ SOLN
INTRAMUSCULAR | Status: AC
  Filled 2015-02-23: qty 2

## 2015-02-23 MED ORDER — TETRACAINE HCL 0.5 % OP SOLN
1.0000 [drp] | OPHTHALMIC | Status: AC
  Administered 2015-02-23 (×3): 1 [drp] via OPHTHALMIC

## 2015-02-23 MED ORDER — MIDAZOLAM HCL 2 MG/2ML IJ SOLN
1.0000 mg | INTRAMUSCULAR | Status: DC | PRN
  Administered 2015-02-23 (×2): 2 mg via INTRAVENOUS

## 2015-02-23 MED ORDER — LACTATED RINGERS IV SOLN
INTRAVENOUS | Status: DC
  Administered 2015-02-23: 10:00:00 via INTRAVENOUS

## 2015-02-23 MED ORDER — POVIDONE-IODINE 5 % OP SOLN
OPHTHALMIC | Status: DC | PRN
  Administered 2015-02-23: 1 via OPHTHALMIC

## 2015-02-23 MED ORDER — MIDAZOLAM HCL 5 MG/5ML IJ SOLN
INTRAMUSCULAR | Status: AC
  Administered 2015-02-23: 2 mg via INTRAVENOUS
  Filled 2015-02-23: qty 5

## 2015-02-23 MED ORDER — PHENYLEPHRINE-KETOROLAC 1-0.3 % IO SOLN
INTRAOCULAR | Status: AC
  Filled 2015-02-23: qty 4

## 2015-02-23 MED ORDER — NA HYALUR & NA CHOND-NA HYALUR 0.55-0.5 ML IO KIT
PACK | INTRAOCULAR | Status: DC | PRN
  Administered 2015-02-23: 1 via OPHTHALMIC

## 2015-02-23 SURGICAL SUPPLY — 9 items
CLOTH BEACON ORANGE TIMEOUT ST (SAFETY) ×2 IMPLANT
GLOVE BIOGEL PI IND STRL 7.0 (GLOVE) IMPLANT
GLOVE BIOGEL PI INDICATOR 7.0 (GLOVE) ×2
GLOVE EXAM NITRILE MD LF STRL (GLOVE) ×2 IMPLANT
GLOVE SURG SS PI 7.5 STRL IVOR (GLOVE) ×2 IMPLANT
INST SET CATARACT ~~LOC~~ (KITS) ×3 IMPLANT
LENS ALC ACRYL/TECN (Ophthalmic Related) ×3 IMPLANT
PAD ARMBOARD 7.5X6 YLW CONV (MISCELLANEOUS) ×2 IMPLANT
WATER STERILE IRR 250ML POUR (IV SOLUTION) ×2 IMPLANT

## 2015-02-23 NOTE — H&P (Signed)
I have reviewed the pre printed H&P, the patient was re-examined, and I have identified no significant interval changes in the patient's medical condition.  There is no change in the plan of care since the history and physical of record. 

## 2015-02-23 NOTE — Anesthesia Postprocedure Evaluation (Signed)
  Anesthesia Post-op Note  Patient: Ryan [name]  Procedure(s) Performed: Procedure(s) with comments: CATARACT EXTRACTION PHACO AND INTRAOCULAR LENS PLACEMENT (IOC) (Right) - CDE:5.02  Patient Location: Short Stay  Anesthesia Type:MAC  Level of Consciousness: awake, alert  and oriented  Airway and Oxygen Therapy: Patient Spontanous Breathing  Post-op Pain: none  Post-op Assessment: Post-op Vital signs reviewed, Patient's Cardiovascular Status Stable, Respiratory Function Stable, Patent Airway and No signs of Nausea or vomiting              Post-op Vital Signs: Reviewed and stable  Last Vitals:  Filed Vitals:   02/23/15 1015  BP: 120/72  Temp:   Resp: 14    Complications: No apparent anesthesia complications

## 2015-02-23 NOTE — Brief Op Note (Signed)
02/23/2015  11:34 AM  PATIENT:  Ryan [name]  59 y.o. male  PRE-OPERATIVE DIAGNOSIS:  nuclear cataract right eye  POST-OPERATIVE DIAGNOSIS:  nuclear cataract right eye  PROCEDURE:  Procedure(s): CATARACT EXTRACTION PHACO AND INTRAOCULAR LENS PLACEMENT ([place])  SURGEON:  Surgeon(s): Williams Che, MD  ASSISTANTS:   Bonney Roussel, CST   ANESTHESIA STAFF: Anesthesiologist: Lerry Liner, MD CRNA: Ollen Bowl, CRNA  ANESTHESIA:   topical and MAC  REQUESTED LENS POWER: 17.5  LENS IMPLANT INFORMATION:   Alcon SN60WF   S/n [id number]  Exp 05/2019  CUMULATIVE DISSIPATED ENERGY:5.02  INDICATIONS:see scanned office H&P  for specific details  OP FINDINGS:dense NS  COMPLICATIONS:None  DICTATION #: none  PLAN OF CARE:   As above  PATIENT DISPOSITION:  Short Stay

## 2015-02-23 NOTE — Transfer of Care (Signed)
Immediate Anesthesia Transfer of Care Note  Patient: Ryan [name]  Procedure(s) Performed: Procedure(s) with comments: CATARACT EXTRACTION PHACO AND INTRAOCULAR LENS PLACEMENT (IOC) (Right) - CDE:5.02  Patient Location: Short Stay  Anesthesia Type:MAC  Level of Consciousness: awake  Airway & Oxygen Therapy: Patient Spontanous Breathing  Post-op Assessment: Report given to RN  Post vital signs: Reviewed  Last Vitals:  Filed Vitals:   02/23/15 1015  BP: 120/72  Temp:   Resp: 14    Complications: No apparent anesthesia complications

## 2015-02-23 NOTE — Op Note (Signed)
02/23/2015  11:34 AM  PATIENT:  Ryan [name]  59 y.o. male  PRE-OPERATIVE DIAGNOSIS:  nuclear cataract right eye  POST-OPERATIVE DIAGNOSIS:  nuclear cataract right eye  PROCEDURE:  Procedure(s): CATARACT EXTRACTION PHACO AND INTRAOCULAR LENS PLACEMENT ([place])  SURGEON:  Surgeon(s): Williams Che, MD  ASSISTANTS:   Bonney Roussel, CST   ANESTHESIA STAFF: Anesthesiologist: Lerry Liner, MD CRNA: Ollen Bowl, CRNA  ANESTHESIA:   topical and MAC  REQUESTED LENS POWER: 17.5  LENS IMPLANT INFORMATION:   Alcon SN60WF   S/n [id number]  Exp 05/2019  CUMULATIVE DISSIPATED ENERGY:5.02  INDICATIONS:see scanned office H&P  for specific details  OP FINDINGS:dense NS  COMPLICATIONS:None  PROCEDURE:  The patient was brought to the operating room in good condition.  The operative eye was prepped and draped in the usual fashion for intraocular surgery.  Lidocaine gel was dropped onto the eye.  A 2.4 mm 10 O'clock near clear corneal stepped incision and a 12 O'clock stab incision were created.  Viscoat was instilled into the anterior chamber.  The 5 mm anterior capsulorhexis was performed with a bent needle cystotome and Utrata forceps.  The lens was hydrodissected and hydrodelineated with a cannula and balanced salt solution and rotated with a Kuglen hook.  Phacoemulsification was perfomed in the divide and conquer technique.  The remaining cortex was removed with I&A and the capsular surfaces polished as necessary.  Provisc was placed into the capsular bag and the lens inserted with the Alcon inserter.  The viscoelastic was removed with I&A and the lens "rocked" into position.  The wounds were hydrated and te anterior chamber was refilled with balanced salt solution.  The wounds were checked for leakage and rehydrated as necessary.  The lid speculum and drapes were removed and the patient was transported to short stay in good condition.  PATIENT DISPOSITION:  Short Stay

## 2015-02-23 NOTE — Discharge Instructions (Signed)
PATIENT INSTRUCTIONS POST-ANESTHESIA  IMMEDIATELY FOLLOWING SURGERY:  Do not drive or operate machinery for the first twenty four hours after surgery.  Do not make any important decisions for twenty four hours after surgery or while taking narcotic pain medications or sedatives.  If you develop intractable nausea and vomiting or a severe headache please notify your doctor immediately.  FOLLOW-UP:  Please make an appointment with your surgeon as instructed. You do not need to follow up with anesthesia unless specifically instructed to do so.  WOUND CARE INSTRUCTIONS (if applicable):  Keep a dry clean dressing on the anesthesia/puncture wound site if there is drainage.  Once the wound has quit draining you may leave it open to air.  Generally you should leave the bandage intact for twenty four hours unless there is drainage.  If the epidural site drains for more than 36-48 hours please call the anesthesia department.  QUESTIONS?:  Please feel free to call your physician or the hospital operator if you have any questions, and they will be happy to assist you.      Cataract A cataract is a clouding of the lens of the eye. When a lens becomes cloudy, vision is reduced based on the degree and nature of the clouding. Many cataracts reduce vision to some degree. Some cataracts make people more near-sighted as they develop. Other cataracts increase glare. Cataracts that are ignored and become worse can sometimes look white. The white color can be seen through the pupil. CAUSES   Aging. However, cataracts may occur at any age, even in newborns.  Certain drugs.  Trauma to the eye.  Certain diseases such as diabetes.  Specific eye diseases such as chronic inflammation inside the eye or a sudden attack of a rare form of glaucoma.  Inherited or acquired medical problems. SYMPTOMS   Gradual, progressive drop in vision in the affected eye.  Severe, rapid visual loss. This most often happens when  trauma is the cause. DIAGNOSIS  To detect a cataract, an eye doctor examines the lens. Cataracts are best diagnosed with an exam of the eyes with the pupils enlarged (dilated) by drops.  TREATMENT  For an early cataract, vision may improve by using different eyeglasses or stronger lighting. If that does not help your vision, surgery is the only effective treatment. A cataract needs to be surgically removed when vision loss interferes with your everyday activities, such as driving, reading, or watching TV. A cataract may also have to be removed if it prevents examination or treatment of another eye problem. Surgery removes the cloudy lens and usually replaces it with a substitute lens (intraocular lens, IOL).  At a time when both you and your doctor agree, the cataract will be surgically removed. If you have cataracts in both eyes, only one is usually removed at a time. This allows the operated eye to heal and be out of danger from any possible problems after surgery (such as infection or poor wound healing). In rare cases, a cataract may be doing damage to your eye. In these cases, your caregiver may advise surgical removal right away. The vast majority of people who have cataract surgery have better vision afterward. HOME CARE INSTRUCTIONS  If you are not planning surgery, you may be asked to do the following:  Use different eyeglasses.  Use stronger or brighter lighting.  Ask your eye doctor about reducing your medicine dose or changing medicines if it is thought that a medicine caused your cataract. Changing medicines does not  make the cataract go away on its own.  Become familiar with your surroundings. Poor vision can lead to injury. Avoid bumping into things on the affected side. You are at a higher risk for tripping or falling.  Exercise extreme care when driving or operating machinery.  Wear sunglasses if you are sensitive to bright light or experiencing problems with glare. SEEK  IMMEDIATE MEDICAL CARE IF:   You have a worsening or sudden vision loss.  You notice redness, swelling, or increasing pain in the eye.  You have a fever. Document Released: 06/27/2005 Document Revised: 09/19/2011 Document Reviewed: 02/18/2011 [facility] Patient Information 2015 [place], [place]. This information is not intended to replace advice given to you by your health care provider. Make sure you discuss any questions you have with your health care provider.

## 2015-02-23 NOTE — Anesthesia Preprocedure Evaluation (Signed)
Anesthesia Evaluation  Patient identified by MRN, date of birth, ID band Patient awake    Reviewed: Allergy & Precautions, NPO status , Patient's Chart, lab work & pertinent test results  Airway Mallampati: III  TM Distance: >3 FB     Dental  (+) Teeth Intact   Pulmonary asthma , sleep apnea and Continuous Positive Airway Pressure Ventilation , COPD COPD inhaler, former smoker,  breath sounds clear to auscultation        Cardiovascular hypertension, Pt. on medications Rhythm:Regular Rate:Normal     Neuro/Psych PSYCHIATRIC DISORDERS Depression    GI/Hepatic GERD-  Medicated and Controlled,  Endo/Other    Renal/GU      Musculoskeletal  (+) Arthritis -,   Abdominal   Peds  Hematology  (+) anemia ,   Anesthesia Other Findings   Reproductive/Obstetrics                             Anesthesia Physical Anesthesia Plan  ASA: III  Anesthesia Plan: MAC   Post-op Pain Management:    Induction: Intravenous  Airway Management Planned: Nasal Cannula  Additional Equipment:   Intra-op Plan:   Post-operative Plan:   Informed Consent: I have reviewed the patients History and Physical, chart, labs and discussed the procedure including the risks, benefits and alternatives for the proposed anesthesia with the patient or authorized representative who has indicated his/her understanding and acceptance.     Plan Discussed with:   Anesthesia Plan Comments:         Anesthesia Quick Evaluation

## 2015-02-24 ENCOUNTER — Encounter (HOSPITAL_COMMUNITY): Payer: Self-pay | Admitting: Ophthalmology

## 2015-06-16 ENCOUNTER — Encounter: Payer: Self-pay | Admitting: Gastroenterology

## 2015-06-16 ENCOUNTER — Other Ambulatory Visit: Payer: Self-pay

## 2015-06-16 DIAGNOSIS — D509 Iron deficiency anemia, unspecified: Secondary | ICD-10-CM

## 2015-07-17 ENCOUNTER — Other Ambulatory Visit: Payer: Self-pay | Admitting: Internal Medicine

## 2015-07-18 LAB — CBC WITH DIFFERENTIAL/PLATELET
BASOS ABS: 0 10*3/uL (ref 0.0–0.2)
BASOS: 1 %
EOS (ABSOLUTE): 0.2 10*3/uL (ref 0.0–0.4)
Eos: 3 %
Hematocrit: 43.4 % (ref 37.5–51.0)
Hemoglobin: 14.7 g/dL (ref 12.6–17.7)
IMMATURE GRANS (ABS): 0 10*3/uL (ref 0.0–0.1)
IMMATURE GRANULOCYTES: 0 %
LYMPHS: 32 %
Lymphocytes Absolute: 1.8 10*3/uL (ref 0.7–3.1)
MCH: 31.7 pg (ref 26.6–33.0)
MCHC: 33.9 g/dL (ref 31.5–35.7)
MCV: 94 fL (ref 79–97)
MONOS ABS: 0.5 10*3/uL (ref 0.1–0.9)
Monocytes: 9 %
NEUTROS PCT: 55 %
Neutrophils Absolute: 3.1 10*3/uL (ref 1.4–7.0)
PLATELETS: 235 10*3/uL (ref 150–379)
RBC: 4.64 x10E6/uL (ref 4.14–5.80)
RDW: 12 % — AB (ref 12.3–15.4)
WBC: 5.6 10*3/uL (ref 3.4–10.8)

## 2015-07-20 NOTE — Progress Notes (Signed)
Quick Note:  CBC normal. I believe he is off iron supplements, correct? ______

## 2015-08-06 ENCOUNTER — Encounter: Payer: Self-pay | Admitting: Gastroenterology

## 2015-10-22 ENCOUNTER — Ambulatory Visit (INDEPENDENT_AMBULATORY_CARE_PROVIDER_SITE_OTHER): Payer: BLUE CROSS/BLUE SHIELD | Admitting: Orthopaedic Surgery

## 2015-10-22 ENCOUNTER — Encounter: Payer: Self-pay | Admitting: Orthopaedic Surgery

## 2015-10-22 VITALS — BP 145/87 | HR 73 | Temp 98.1°F | Resp 16 | Ht 70.0 in | Wt 221.0 lb

## 2015-10-22 DIAGNOSIS — M67472 Ganglion, left ankle and foot: Secondary | ICD-10-CM | POA: Diagnosis not present

## 2015-10-23 NOTE — Progress Notes (Signed)
Patient CQ:[id number] Ryan [name], male DOB:08-03-1955, 60 y.o. [id number]  Chief Complaint  Patient presents with  . Cyst    cyst left foot    HPI  Ryan [name] is a 60 y.o. male who has ganglion cyst of the left foot at the base of the fifth metatarsal.  It is still present.  It went down for a little bit but it is enlarged again and tender.  He has no trauma, no redness, no numbness.  It bothers him in shoe wear and where it is located.  He would like to have it excised.  I will have him see Dr. Aline Brochure.  He does not want it aspirated as he says it will probably come back again he wants it taken care of.  HPI  Body mass index is 31.71 kg/(m^2).  Review of Systems  HENT: Negative for congestion.   Respiratory: Positive for shortness of breath. Negative for cough.   Cardiovascular: Negative for leg swelling.  Endocrine: Negative for cold intolerance.  Musculoskeletal: Positive for myalgias and gait problem.  Allergic/Immunologic: Negative for environmental allergies.    Past Medical History  Diagnosis Date  . Hypertension     diet controlled  . COPD (chronic obstructive pulmonary disease) ([place])   . High cholesterol     diet controlled  . Arthritis   . Asthma   . Sleep apnea     CPAP  . Anemia   . GERD (gastroesophageal reflux disease)   . Depression     Past Surgical History  Procedure Laterality Date  . Cholecystectomy    . Colonoscopy  03/15/2005    Dr. Rourk:Rectal polyps (diminutive), status post cold biopsy/removal/normal colon. hyperplastic  . Colonoscopy N/A 08/27/2014    Dr. Gala Romney: Multiple rectal and colonic polyps removed, one which was a 1 cm sessile polyp. Cecal AVMS ablated as described above. colonic Diverticulosis. Path with rectal hyperplastic polyp, ascending colon polyps with benign colonic mucosa, sessile serrated polyp with associated lipoma. Recommendation for surveillance colonoscopy in 3 years.   . Esophagogastroduodenoscopy N/A 08/27/2014    Dr.  Rourk:Tiny distal esophageal erosions consistent with mild erosive reflux esophaigitis. Hiatal hernia. Antral erosions status post gastric biopsy. mild chronic gastritis, negative H.pylori  . Givens capsule study N/A 11/05/2014    Procedure: GIVENS CAPSULE STUDY;  Surgeon: Daneil Dolin, MD;  Location: [facility];  Service: Endoscopy;  Laterality: N/A;  0800  . Knee arthroscopy Right   . Cataract extraction w/phaco Right 02/23/2015    Procedure: CATARACT EXTRACTION PHACO AND INTRAOCULAR LENS PLACEMENT (IOC);  Surgeon: Williams Che, MD;  Location: [facility] ORS;  Service: Ophthalmology;  Laterality: Right;  CDE:5.02    Family History  Problem Relation Age of Onset  . Colon cancer Neg Hx   . Cancer Mother     unsure primary    Social History Social History  Substance Use Topics  . Smoking status: Former Smoker -- 2.00 packs/day for 35 years    Types: Cigarettes    Quit date: 10/16/2010  . Smokeless tobacco: None  . Alcohol Use: 0.0 oz/week    0 Standard drinks or equivalent per week     Comment: couple of drinks of borboun every night    Allergies  Allergen Reactions  . Latex   . Tiotropium Bromide Monohydrate Other (See Comments)    Patient reports blurred vision after taking Spiriva    Current Outpatient Prescriptions  Medication Sig Dispense Refill  . acetaminophen (TYLENOL) 500 MG tablet Take  500 mg by mouth 3 (three) times daily.     . fluticasone-salmeterol (ADVAIR HFA) 115-21 MCG/ACT inhaler Inhale 1-2 puffs into the lungs 2 (two) times daily. 2 puffs in the morning and 1 puff in the evening.    . gabapentin (NEURONTIN) 100 MG capsule Take 100 mg by mouth 3 (three) times daily.     . lansoprazole (PREVACID SOLUTAB) 15 MG disintegrating tablet Take 15 mg by mouth daily.     . mirtazapine (REMERON) 15 MG tablet Take 15 mg by mouth at bedtime.     Marland Kitchen PRAVASTATIN SODIUM PO Take by mouth.    . traMADol (ULTRAM) 50 MG tablet Take 50 mg by mouth 3 (three) times daily.     Marland Kitchen  zolpidem (AMBIEN) 10 MG tablet Take 10 mg by mouth at bedtime as needed for sleep.      No current facility-administered medications for this visit.     Physical Exam  Blood pressure 145/87, pulse 73, temperature 98.1 F (36.7 C), resp. rate 16, height 5\' 10"  (1.778 m), weight 221 lb (100.245 kg).  Constitutional: overall normal hygiene, normal nutrition, well developed, normal grooming, normal body habitus. Assistive device:none  Musculoskeletal: gait and station Limp none, muscle tone and strength are normal, no tremors or atrophy is present.  .  Neurological: coordination overall normal.  Deep tendon reflex/nerve stretch intact.  Sensation normal.  Cranial nerves II-XII intact.   Skin:   normal overall no scars, lesions, ulcers or rashes. No psoriasis.  Psychiatric: Alert and oriented x 3.  Recent memory intact, remote memory unclear.  Normal mood and affect. Well groomed.  Good eye contact.  Cardiovascular: overall no swelling, no varicosities, no edema bilaterally, normal temperatures of the legs and arms, no clubbing, cyanosis and good capillary refill.  Lymphatic: palpation is normal.   Extremities:the left lateral foot at the base of the fifth metacarpal has a large cystic lesion, about size of a large grape.  It is not red, no moveable.  NV is intact. Inspection as above Strength and tone normal Range of motion full of left foot and ankle and toes.  Right foot and ankle are negative.  The patient has been educated about the nature of the problem(s) and counseled on treatment options.  The patient appeared to understand what I have discussed and is in agreement with it.  Encounter Diagnosis  Name Primary?  . Ganglion cyst of left foot Yes    PLAN Call if any problems.  Precautions discussed.  Continue current medications.   Return to clinic to see Dr. Aline Brochure for evaluation for surgery.

## 2015-11-09 ENCOUNTER — Encounter: Payer: Self-pay | Admitting: Orthopedic Surgery

## 2015-11-09 ENCOUNTER — Ambulatory Visit (INDEPENDENT_AMBULATORY_CARE_PROVIDER_SITE_OTHER): Payer: BLUE CROSS/BLUE SHIELD | Admitting: Orthopedic Surgery

## 2015-11-09 ENCOUNTER — Ambulatory Visit (INDEPENDENT_AMBULATORY_CARE_PROVIDER_SITE_OTHER): Payer: BLUE CROSS/BLUE SHIELD

## 2015-11-09 VITALS — BP 139/83 | Ht 70.0 in | Wt 221.0 lb

## 2015-11-09 DIAGNOSIS — M85672 Other cyst of bone, left ankle and foot: Secondary | ICD-10-CM

## 2015-11-11 ENCOUNTER — Emergency Department (HOSPITAL_COMMUNITY)
Admission: EM | Admit: 2015-11-11 | Discharge: 2015-11-11 | Disposition: A | Discharge status: Home / Self Care | Payer: BLUE CROSS/BLUE SHIELD | Attending: Emergency Medicine | Admitting: Emergency Medicine

## 2015-11-11 ENCOUNTER — Emergency Department (HOSPITAL_COMMUNITY): Payer: BLUE CROSS/BLUE SHIELD

## 2015-11-11 ENCOUNTER — Encounter (HOSPITAL_COMMUNITY): Payer: Self-pay | Admitting: Emergency Medicine

## 2015-11-11 DIAGNOSIS — I10 Essential (primary) hypertension: Secondary | ICD-10-CM | POA: Diagnosis not present

## 2015-11-11 DIAGNOSIS — J45909 Unspecified asthma, uncomplicated: Secondary | ICD-10-CM | POA: Diagnosis not present

## 2015-11-11 DIAGNOSIS — Y939 Activity, unspecified: Secondary | ICD-10-CM | POA: Diagnosis not present

## 2015-11-11 DIAGNOSIS — Z87891 Personal history of nicotine dependence: Secondary | ICD-10-CM | POA: Diagnosis not present

## 2015-11-11 DIAGNOSIS — J449 Chronic obstructive pulmonary disease, unspecified: Secondary | ICD-10-CM | POA: Insufficient documentation

## 2015-11-11 DIAGNOSIS — F329 Major depressive disorder, single episode, unspecified: Secondary | ICD-10-CM | POA: Diagnosis not present

## 2015-11-11 DIAGNOSIS — Y999 Unspecified external cause status: Secondary | ICD-10-CM | POA: Insufficient documentation

## 2015-11-11 DIAGNOSIS — S40012A Contusion of left shoulder, initial encounter: Secondary | ICD-10-CM | POA: Insufficient documentation

## 2015-11-11 DIAGNOSIS — S60222A Contusion of left hand, initial encounter: Secondary | ICD-10-CM | POA: Insufficient documentation

## 2015-11-11 DIAGNOSIS — S8002XA Contusion of left knee, initial encounter: Secondary | ICD-10-CM | POA: Diagnosis not present

## 2015-11-11 DIAGNOSIS — T07XXXA Unspecified multiple injuries, initial encounter: Secondary | ICD-10-CM

## 2015-11-11 DIAGNOSIS — W1789XA Other fall from one level to another, initial encounter: Secondary | ICD-10-CM | POA: Diagnosis not present

## 2015-11-11 DIAGNOSIS — Y929 Unspecified place or not applicable: Secondary | ICD-10-CM | POA: Insufficient documentation

## 2015-11-11 DIAGNOSIS — S4992XA Unspecified injury of left shoulder and upper arm, initial encounter: Secondary | ICD-10-CM | POA: Diagnosis present

## 2015-11-11 MED ORDER — HYDROCODONE-ACETAMINOPHEN 5-325 MG PO TABS: 1.0000 | ORAL_TABLET | ORAL | Status: DC | PRN

## 2015-11-11 NOTE — ED Provider Notes (Signed)
CSN: [id number]     Arrival date & time 11/11/15  1713 History   First MD Initiated Contact with Patient 11/11/15 1800     Chief Complaint  Patient presents with  . Fall     (Consider location/radiation/quality/duration/timing/severity/associated sxs/prior Treatment) HPI Comments: Patient is a 60 year old male who presents to the emergency department with a complaint of multiple injuries following a fall.  The patient states that he was unloading a scrap metal from a truck, so the metal pipes came loose, his feet got tangled, and he fell out of the truck. He injured his left shoulder, left hand, and left knee. Patient denies any difficulty with breathing. There was no loss of consciousness. The patient is ambulatory. He denies being on any anticoagulation medications. He has no history of any bleeding disorders. There's been no previous operations or procedures involving the side of the head, the shoulder, the hand, or the knee on the left.  Patient is a 60 y.o. male presenting with fall. The history is provided by the patient.  Fall This is a new problem. The current episode started today. Associated symptoms include arthralgias. Pertinent negatives include no abdominal pain, chest pain, coughing or neck pain.    Past Medical History  Diagnosis Date  . Hypertension     diet controlled  . COPD (chronic obstructive pulmonary disease) ([place])   . High cholesterol     diet controlled  . Arthritis   . Asthma   . Sleep apnea     CPAP  . Anemia   . GERD (gastroesophageal reflux disease)   . Depression    Past Surgical History  Procedure Laterality Date  . Cholecystectomy    . Colonoscopy  03/15/2005    Dr. Rourk:Rectal polyps (diminutive), status post cold biopsy/removal/normal colon. hyperplastic  . Colonoscopy N/A 08/27/2014    Dr. Gala Romney: Multiple rectal and colonic polyps removed, one which was a 1 cm sessile polyp. Cecal AVMS ablated as described above. colonic Diverticulosis. Path  with rectal hyperplastic polyp, ascending colon polyps with benign colonic mucosa, sessile serrated polyp with associated lipoma. Recommendation for surveillance colonoscopy in 3 years.   . Esophagogastroduodenoscopy N/A 08/27/2014    Dr. Rourk:Tiny distal esophageal erosions consistent with mild erosive reflux esophaigitis. Hiatal hernia. Antral erosions status post gastric biopsy. mild chronic gastritis, negative H.pylori  . Givens capsule study N/A 11/05/2014    Procedure: GIVENS CAPSULE STUDY;  Surgeon: Daneil Dolin, MD;  Location: [facility];  Service: Endoscopy;  Laterality: N/A;  0800  . Knee arthroscopy Right   . Cataract extraction w/phaco Right 02/23/2015    Procedure: CATARACT EXTRACTION PHACO AND INTRAOCULAR LENS PLACEMENT (IOC);  Surgeon: Williams Che, MD;  Location: [facility];  Service: Ophthalmology;  Laterality: Right;  CDE:5.02   Family History  Problem Relation Age of Onset  . Colon cancer Neg Hx   . Cancer Mother     unsure primary   Social History  Substance Use Topics  . Smoking status: Former Smoker -- 2.00 packs/day for 35 years    Types: Cigarettes    Quit date: 10/16/2010  . Smokeless tobacco: None  . Alcohol Use: 0.0 oz/week    0 Standard drinks or equivalent per week     Comment: couple of drinks of borboun every night    Review of Systems  Constitutional: Negative for activity change and appetite change.       All ROS Neg except as noted in HPI  HENT: Negative for nosebleeds.  Eyes: Negative for photophobia and discharge.  Respiratory: Negative for cough, shortness of breath and wheezing.   Cardiovascular: Negative for chest pain and palpitations.  Gastrointestinal: Negative for abdominal pain and blood in stool.  Genitourinary: Negative for dysuria, frequency and hematuria.  Musculoskeletal: Positive for arthralgias. Negative for back pain and neck pain.  Skin: Negative.   Neurological: Negative for dizziness, seizures and speech difficulty.   Psychiatric/Behavioral: Negative for hallucinations and confusion.  All other systems reviewed and are negative.     Allergies  Latex and Tiotropium bromide monohydrate  Home Medications   Prior to Admission medications   Medication Sig Start Date End Date Taking? Authorizing Provider  acetaminophen (TYLENOL) 500 MG tablet Take 500 mg by mouth 3 (three) times daily.     Historical Provider, MD  fluticasone-salmeterol (ADVAIR HFA) 115-21 MCG/ACT inhaler Inhale 1-2 puffs into the lungs 2 (two) times daily. 2 puffs in the morning and 1 puff in the evening. 09/14/10   Historical Provider, MD  gabapentin (NEURONTIN) 100 MG capsule Take 100 mg by mouth 3 (three) times daily.  03/09/10   Historical Provider, MD  lansoprazole (PREVACID SOLUTAB) 15 MG disintegrating tablet Take 15 mg by mouth daily.     Historical Provider, MD  mirtazapine (REMERON) 15 MG tablet Take 15 mg by mouth at bedtime.     Historical Provider, MD  PRAVASTATIN SODIUM PO Take by mouth.    Historical Provider, MD  traMADol (ULTRAM) 50 MG tablet Take 50 mg by mouth 3 (three) times daily.  03/12/10   Historical Provider, MD  zolpidem (AMBIEN) 10 MG tablet Take 10 mg by mouth at bedtime as needed for sleep.  03/09/10   Historical Provider, MD   BP 158/88 mmHg  Pulse 74  Temp(Src) 98.9 F (37.2 C)  Resp 20  Ht 5\' 10"  (1.778 m)  Wt 95.255 kg  BMI 30.13 kg/m2  SpO2 97% Physical Exam  Constitutional: He is oriented to person, place, and time. He appears well-developed and well-nourished.  Non-toxic appearance.  HENT:  Right Ear: Tympanic membrane and external ear normal.  Left Ear: Tympanic membrane and external ear normal.  There is a shallow abrasion of the left eyebrow area. Bleeding is controlled on. There is no orbit pain. Is no TMJ pain. There is no facial deformity appreciated.  Eyes: EOM and lids are normal. Pupils are equal, round, and reactive to light.  Neck: Normal range of motion. Neck supple. Carotid bruit is not  present.  Cardiovascular: Normal rate, regular rhythm, normal heart sounds, intact distal pulses and normal pulses.   Pulmonary/Chest: Breath sounds normal. No respiratory distress.  Abdominal: Soft. Bowel sounds are normal. There is no tenderness. There is no guarding.  Musculoskeletal: Normal range of motion.  The patient has significant crepitus involving the left greater than right shoulder. This is not new for the patient. He does have range of motion present and there is no evidence of any dislocation. There is an abrasion at the anterior left shoulder. There's no deformity of the humeral area. There is full range of motion of the left elbow and wrist. There is pain at the dorsum of the hand over the fifth metacarpal area. There is bruising in this area. Capillary refill is less than 2 seconds and the radial  pulses 2+ bilaterally. There is an abrasion of the left knee. Bleeding is controlled. The patient has full range of motion of the left knee. There is no effusion of the joint on. This full  range of motion of the left foot and ankle.  Lymphadenopathy:       Head (right side): No submandibular adenopathy present.       Head (left side): No submandibular adenopathy present.    He has no cervical adenopathy.  Neurological: He is alert and oriented to person, place, and time. He has normal strength. No cranial nerve deficit or sensory deficit.  Skin: Skin is warm and dry.  Psychiatric: He has a normal mood and affect. His speech is normal.  Nursing note and vitals reviewed.   ED Course  Procedures (including critical care time) Labs Review Labs Reviewed - No data to display  Imaging Review No results found. I have personally reviewed and evaluated these images and lab results as part of my medical decision-making.   EKG Interpretation None      MDM  The patient has multiple abrasions, but no neurovascular deficit appreciated at this time. X-ray of the left hand is negative for  fracture or dislocation.  Patient will be treated for contusion multiple sites. He is to keep the wounds clean and dry. He states his tetanus is up-to-date. Prescription for Norco one every 4 hours was given for use for pain not improved by Tylenol or ibuprofen.    Final diagnoses:  Contusion, multiple sites    **I have reviewed nursing notes, vital signs, and all appropriate lab and imaging results for this patient.Lily Kocher, PA-C 11/12/15 1905  Julianne Rice, MD 11/18/15 [phone]

## 2015-11-11 NOTE — Progress Notes (Signed)
Chief Complaint  Patient presents with  . Cyst    cyst left foot    HPI  Dr. Luna Glasgow has asked me to see this 60 year old male has a ganglion cyst on his left foot at the base of the fifth metatarsal. It changes in size and shape and has become tender. It seems it started after he did a excess amount of walking. He does not complain of numbness or tingling no redness is noted it is bothering his shoe wear. He is interested in having it excised. Dr. Luna Glasgow has sent him to me for evaluation    HPI  Body mass index is 31.71 kg/(m^2).  Review of Systems as noted in Dr. Brooke Bonito review of systems on April 14 he complains of occasional shortness of breath myalgias and gait problem denies fever chills.     Past Medical History  Diagnosis Date  . Hypertension     diet controlled  . COPD (chronic obstructive pulmonary disease) ([place])   . High cholesterol     diet controlled  . Arthritis   . Asthma   . Sleep apnea     CPAP  . Anemia   . GERD (gastroesophageal reflux disease)   . Depression     Past Surgical History  Procedure Laterality Date  . Cholecystectomy    . Colonoscopy  03/15/2005    Dr. Rourk:Rectal polyps (diminutive), status post cold biopsy/removal/normal colon. hyperplastic  . Colonoscopy N/A 08/27/2014    Dr. Gala Romney: Multiple rectal and colonic polyps removed, one which was a 1 cm sessile polyp. Cecal AVMS ablated as described above. colonic Diverticulosis. Path with rectal hyperplastic polyp, ascending colon polyps with benign colonic mucosa, sessile serrated polyp with associated lipoma. Recommendation for surveillance colonoscopy in 3 years.   . Esophagogastroduodenoscopy N/A 08/27/2014    Dr. Rourk:Tiny distal esophageal erosions consistent with mild erosive reflux esophaigitis. Hiatal hernia. Antral erosions status post gastric biopsy. mild chronic gastritis, negative H.pylori  . Givens  capsule study N/A 11/05/2014    Procedure: GIVENS CAPSULE STUDY; Surgeon: Daneil Dolin, MD; Location: [facility]; Service: Endoscopy; Laterality: N/A; 0800  . Knee arthroscopy Right   . Cataract extraction w/phaco Right 02/23/2015    Procedure: CATARACT EXTRACTION PHACO AND INTRAOCULAR LENS PLACEMENT (IOC); Surgeon: Williams Che, MD; Location: [facility]; Service: Ophthalmology; Laterality: Right; CDE:5.02    Family History  Problem Relation Age of Onset  . Colon cancer Neg Hx   . Cancer Mother     unsure primary    Social History Social History  Substance Use Topics  . Smoking status: Former Smoker -- 2.00 packs/day for 35 years    Types: Cigarettes    Quit date: 10/16/2010  . Smokeless tobacco: None  . Alcohol Use: 0.0 oz/week    0 Standard drinks or equivalent per week     Comment: couple of drinks of borboun every night    Allergies  Allergen Reactions  . Latex   . Tiotropium Bromide Monohydrate Other (See Comments)    Patient reports blurred vision after taking Spiriva    Current Outpatient Prescriptions  Medication Sig Dispense Refill  . acetaminophen (TYLENOL) 500 MG tablet Take 500 mg by mouth 3 (three) times daily.     . fluticasone-salmeterol (ADVAIR HFA) 115-21 MCG/ACT inhaler Inhale 1-2 puffs into the lungs 2 (two) times daily. 2 puffs in the morning and 1 puff in the evening.    . gabapentin (NEURONTIN) 100 MG capsule Take 100 mg by mouth 3 (three)  times daily.     . lansoprazole (PREVACID SOLUTAB) 15 MG disintegrating tablet Take 15 mg by mouth daily.     . mirtazapine (REMERON) 15 MG tablet Take 15 mg by mouth at bedtime.     Marland Kitchen PRAVASTATIN SODIUM PO Take by mouth.    . traMADol (ULTRAM) 50 MG tablet Take 50 mg by mouth 3 (three) times daily.     Marland Kitchen zolpidem (AMBIEN) 10 MG tablet Take 10 mg by mouth at bedtime as needed for sleep.           BP 139/83 mmHg  Ht 5\' 10"  (1.778 m)  Wt 221 lb (100.245 kg)  BMI 31.71 kg/m2 This is a well-developed well-nourished gentleman normal grooming and hygiene. He is oriented 3. His mood is normal. Affect normal.  He has no ambulatory related gait changes.  He has a 3 x 2 cm mass in the dorsal lateral aspect of his left foot rubbery to touch mobile under the skin. His ankle range of motion is normal his ankle is stable he has no atrophy in his foot skin is otherwise intact although there may be some vascularity over the lesion such as a vein. Has good distal pulse normal capillary refill normal sensation. There is no lymphadenopathy associated.  X-ray was obtained I read the x-ray as normal foot with soft tissue mass  The patient agreed to aspiration and we aspirated bloody gelatinous fluid consistent with ganglion cyst  Patient will call us back when he wants it removed it is likely to recur after aspiration.

## 2015-11-11 NOTE — ED Notes (Signed)
Patient verbalizes understanding of discharge instructions, home care and follow up care. Patient out of department at this time. 

## 2015-11-11 NOTE — Discharge Instructions (Signed)
Your x-ray is negative for fracture or dislocation. Please use the Ace wrap and ice pack over the next 48 hours. Elevate your hand as much as possible. Use Tylenol or ibuprofen for mild pain, may use Norco for more severe pain. Please see your primary physician, or return to the emergency department if not improving.

## 2015-11-11 NOTE — Progress Notes (Signed)
Aspiration ganglion cyst left foot  Verbal consent was obtained and a timeout was completed  The area was frozen with ethyl chloride and clean with alcohol  Aspirated 7 mL of gelatinous fluid with slight blood tinged component  Band-Aid was applied
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
## 2016-01-07 DIAGNOSIS — G4733 Obstructive sleep apnea (adult) (pediatric): Secondary | ICD-10-CM | POA: Diagnosis not present

## 2016-01-28 DIAGNOSIS — Z23 Encounter for immunization: Secondary | ICD-10-CM | POA: Diagnosis not present

## 2016-01-28 DIAGNOSIS — Z6832 Body mass index (BMI) 32.0-32.9, adult: Secondary | ICD-10-CM | POA: Diagnosis not present

## 2016-01-28 DIAGNOSIS — I10 Essential (primary) hypertension: Secondary | ICD-10-CM | POA: Diagnosis not present

## 2016-04-07 DIAGNOSIS — G4733 Obstructive sleep apnea (adult) (pediatric): Secondary | ICD-10-CM | POA: Diagnosis not present

## 2016-04-14 DIAGNOSIS — Z961 Presence of intraocular lens: Secondary | ICD-10-CM | POA: Diagnosis not present

## 2016-04-14 DIAGNOSIS — H43391 Other vitreous opacities, right eye: Secondary | ICD-10-CM | POA: Diagnosis not present

## 2016-05-05 DIAGNOSIS — H2512 Age-related nuclear cataract, left eye: Secondary | ICD-10-CM | POA: Diagnosis not present

## 2016-05-05 DIAGNOSIS — H43811 Vitreous degeneration, right eye: Secondary | ICD-10-CM | POA: Diagnosis not present

## 2016-05-10 DIAGNOSIS — G4733 Obstructive sleep apnea (adult) (pediatric): Secondary | ICD-10-CM | POA: Diagnosis not present

## 2016-05-17 DIAGNOSIS — Z23 Encounter for immunization: Secondary | ICD-10-CM | POA: Diagnosis not present

## 2016-05-17 DIAGNOSIS — G4733 Obstructive sleep apnea (adult) (pediatric): Secondary | ICD-10-CM | POA: Diagnosis not present

## 2016-05-17 DIAGNOSIS — Z9989 Dependence on other enabling machines and devices: Secondary | ICD-10-CM | POA: Diagnosis not present

## 2016-05-17 DIAGNOSIS — R0602 Shortness of breath: Secondary | ICD-10-CM | POA: Diagnosis not present

## 2016-06-10 DIAGNOSIS — G4733 Obstructive sleep apnea (adult) (pediatric): Secondary | ICD-10-CM | POA: Diagnosis not present

## 2016-06-30 DIAGNOSIS — Z961 Presence of intraocular lens: Secondary | ICD-10-CM | POA: Diagnosis not present

## 2016-06-30 DIAGNOSIS — H43813 Vitreous degeneration, bilateral: Secondary | ICD-10-CM | POA: Diagnosis not present

## 2016-06-30 DIAGNOSIS — H01021 Squamous blepharitis right upper eyelid: Secondary | ICD-10-CM | POA: Diagnosis not present

## 2016-06-30 DIAGNOSIS — H25812 Combined forms of age-related cataract, left eye: Secondary | ICD-10-CM | POA: Diagnosis not present

## 2016-07-10 DIAGNOSIS — G4733 Obstructive sleep apnea (adult) (pediatric): Secondary | ICD-10-CM | POA: Diagnosis not present

## 2016-07-11 HISTORY — PX: CATARACT EXTRACTION: SUR2

## 2016-07-22 DIAGNOSIS — H2512 Age-related nuclear cataract, left eye: Secondary | ICD-10-CM | POA: Diagnosis not present

## 2016-07-22 DIAGNOSIS — H43811 Vitreous degeneration, right eye: Secondary | ICD-10-CM | POA: Diagnosis not present

## 2016-07-22 DIAGNOSIS — H43391 Other vitreous opacities, right eye: Secondary | ICD-10-CM | POA: Diagnosis not present

## 2016-07-22 DIAGNOSIS — Z961 Presence of intraocular lens: Secondary | ICD-10-CM | POA: Diagnosis not present

## 2016-07-29 DIAGNOSIS — E785 Hyperlipidemia, unspecified: Secondary | ICD-10-CM | POA: Diagnosis not present

## 2016-07-29 DIAGNOSIS — Z125 Encounter for screening for malignant neoplasm of prostate: Secondary | ICD-10-CM | POA: Diagnosis not present

## 2016-07-29 DIAGNOSIS — Z79899 Other long term (current) drug therapy: Secondary | ICD-10-CM | POA: Diagnosis not present

## 2016-07-29 DIAGNOSIS — I10 Essential (primary) hypertension: Secondary | ICD-10-CM | POA: Diagnosis not present

## 2016-08-02 DIAGNOSIS — I10 Essential (primary) hypertension: Secondary | ICD-10-CM | POA: Diagnosis not present

## 2016-08-02 DIAGNOSIS — Z6832 Body mass index (BMI) 32.0-32.9, adult: Secondary | ICD-10-CM | POA: Diagnosis not present

## 2016-08-02 DIAGNOSIS — E785 Hyperlipidemia, unspecified: Secondary | ICD-10-CM | POA: Diagnosis not present

## 2016-08-29 DIAGNOSIS — G4733 Obstructive sleep apnea (adult) (pediatric): Secondary | ICD-10-CM | POA: Diagnosis not present

## 2016-11-04 DIAGNOSIS — G4733 Obstructive sleep apnea (adult) (pediatric): Secondary | ICD-10-CM | POA: Diagnosis not present

## 2016-12-07 DIAGNOSIS — G4733 Obstructive sleep apnea (adult) (pediatric): Secondary | ICD-10-CM | POA: Diagnosis not present

## 2017-01-02 DIAGNOSIS — H01022 Squamous blepharitis right lower eyelid: Secondary | ICD-10-CM | POA: Diagnosis not present

## 2017-01-02 DIAGNOSIS — H25812 Combined forms of age-related cataract, left eye: Secondary | ICD-10-CM | POA: Diagnosis not present

## 2017-01-02 DIAGNOSIS — H01021 Squamous blepharitis right upper eyelid: Secondary | ICD-10-CM | POA: Diagnosis not present

## 2017-01-02 DIAGNOSIS — H43813 Vitreous degeneration, bilateral: Secondary | ICD-10-CM | POA: Diagnosis not present

## 2017-01-06 DIAGNOSIS — G4733 Obstructive sleep apnea (adult) (pediatric): Secondary | ICD-10-CM | POA: Diagnosis not present

## 2017-02-06 DIAGNOSIS — G4733 Obstructive sleep apnea (adult) (pediatric): Secondary | ICD-10-CM | POA: Diagnosis not present

## 2017-02-13 ENCOUNTER — Ambulatory Visit (INDEPENDENT_AMBULATORY_CARE_PROVIDER_SITE_OTHER): Payer: BLUE CROSS/BLUE SHIELD | Admitting: Orthopedic Surgery

## 2017-02-13 DIAGNOSIS — M85672 Other cyst of bone, left ankle and foot: Secondary | ICD-10-CM

## 2017-02-13 NOTE — Progress Notes (Signed)
This is a follow-up visit status post aspiration cyst left foot  The patient is 62 years old he has had 2 aspirations of a cyst from his left foot it came back and came back with a vengeance larger in size and then finally broke open and drained. He treated with peroxide and topical antibiotic and prevented any infection  Review of systems no fever or chills  Physical examination His appearance is normal He walks without a limp  Lateral portion left foot large ganglion cyst firm and soft depending on which area is palpated Skin shows some mild erythema there neurovascular exam is intact there is no atrophy ankle is stable and there is full range of motion  2.5 x 3 cm ganglion cyst  Encounter Diagnosis  Name Primary?  . Other cyst of bone, left ankle and foot Yes     The patient would like this removed sometime in November he will call us to set up a date

## 2017-02-16 DIAGNOSIS — I10 Essential (primary) hypertension: Secondary | ICD-10-CM | POA: Diagnosis not present

## 2017-02-16 DIAGNOSIS — M1990 Unspecified osteoarthritis, unspecified site: Secondary | ICD-10-CM | POA: Diagnosis not present

## 2017-03-01 DIAGNOSIS — H25812 Combined forms of age-related cataract, left eye: Secondary | ICD-10-CM | POA: Diagnosis not present

## 2017-03-15 ENCOUNTER — Encounter: Payer: Self-pay | Admitting: Orthopaedic Surgery

## 2017-03-15 ENCOUNTER — Ambulatory Visit (INDEPENDENT_AMBULATORY_CARE_PROVIDER_SITE_OTHER): Payer: BLUE CROSS/BLUE SHIELD

## 2017-03-15 ENCOUNTER — Ambulatory Visit (INDEPENDENT_AMBULATORY_CARE_PROVIDER_SITE_OTHER): Payer: BLUE CROSS/BLUE SHIELD | Admitting: Orthopaedic Surgery

## 2017-03-15 VITALS — BP 127/83 | HR 70 | Temp 97.8°F | Ht 70.0 in | Wt 230.0 lb

## 2017-03-15 DIAGNOSIS — M25571 Pain in right ankle and joints of right foot: Secondary | ICD-10-CM | POA: Diagnosis not present

## 2017-03-15 NOTE — Progress Notes (Signed)
Patient OV:ZCHYIF Ryan [name], male DOB:10-Jul-1956, 61 y.o. OYD:[id number]  Chief Complaint  Patient presents with  . New Problem    Right achilles pain    HPI  Ryan [name] is a 61 y.o. male who has pain of the right posterior heel area of the distal Achilles tendon.  He has a pair of old shoes he loves to wear that are about worn out.  He has come to realize the shoes have rubbed the posterior heel and Achilles area and causing a "knot" to develop.  He changed shoes this weekend and had much less pain.  He says he sort of figured it all out but wanted the foot checked to make sure.  He has a callus in the area.  He has no redness, no fluctuance.  HPI  Body mass index is 33 kg/m.  ROS  Review of Systems  HENT: Negative for congestion.   Respiratory: Positive for shortness of breath. Negative for cough.   Cardiovascular: Negative for leg swelling.  Endocrine: Negative for cold intolerance.  Musculoskeletal: Positive for gait problem and myalgias.  Allergic/Immunologic: Negative for environmental allergies.    Past Medical History:  Diagnosis Date  . Anemia   . Arthritis   . Asthma   . COPD (chronic obstructive pulmonary disease) ([place])   . Depression   . GERD (gastroesophageal reflux disease)   . High cholesterol    diet controlled  . Hypertension    diet controlled  . Sleep apnea    CPAP    Past Surgical History:  Procedure Laterality Date  . CATARACT EXTRACTION W/PHACO Right 02/23/2015   Procedure: CATARACT EXTRACTION PHACO AND INTRAOCULAR LENS PLACEMENT (IOC);  Surgeon: Williams Che, MD;  Location: [facility];  Service: Ophthalmology;  Laterality: Right;  CDE:5.02  . CHOLECYSTECTOMY    . COLONOSCOPY  03/15/2005   Dr. Rourk:Rectal polyps (diminutive), status post cold biopsy/removal/normal colon. hyperplastic  . COLONOSCOPY N/A 08/27/2014   Dr. Gala Romney: Multiple rectal and colonic polyps removed, one which was a 1 cm sessile polyp. Cecal AVMS ablated as described above.  colonic Diverticulosis. Path with rectal hyperplastic polyp, ascending colon polyps with benign colonic mucosa, sessile serrated polyp with associated lipoma. Recommendation for surveillance colonoscopy in 3 years.   . ESOPHAGOGASTRODUODENOSCOPY N/A 08/27/2014   Dr. Rourk:Tiny distal esophageal erosions consistent with mild erosive reflux esophaigitis. Hiatal hernia. Antral erosions status post gastric biopsy. mild chronic gastritis, negative H.pylori  . GIVENS CAPSULE STUDY N/A 11/05/2014   Procedure: GIVENS CAPSULE STUDY;  Surgeon: Daneil Dolin, MD;  Location: [facility];  Service: Endoscopy;  Laterality: N/A;  0800  . KNEE ARTHROSCOPY Right     Family History  Problem Relation Age of Onset  . Cancer Mother        unsure primary  . Colon cancer Neg Hx     Social History Social History  Substance Use Topics  . Smoking status: Former Smoker    Packs/day: 2.00    Years: 35.00    Types: Cigarettes    Quit date: 10/16/2010  . Smokeless tobacco: Never Used  . Alcohol use 0.0 oz/week     Comment: couple of drinks of borboun every night    Allergies  Allergen Reactions  . Latex   . Tiotropium Bromide Monohydrate Other (See Comments)    Patient reports blurred vision after taking Spiriva    Current Outpatient Prescriptions  Medication Sig Dispense Refill  . acetaminophen (TYLENOL) 500 MG tablet Take 500 mg by mouth  3 (three) times daily.     . fluticasone-salmeterol (ADVAIR HFA) 115-21 MCG/ACT inhaler Inhale 1-2 puffs into the lungs 2 (two) times daily. 2 puffs in the morning and 1 puff in the evening.    . gabapentin (NEURONTIN) 100 MG capsule Take 100 mg by mouth 3 (three) times daily.     Marland Kitchen HYDROcodone-acetaminophen (NORCO/VICODIN) 5-325 MG tablet Take 1 tablet by mouth every 4 (four) hours as needed. 15 tablet 0  . lansoprazole (PREVACID SOLUTAB) 15 MG disintegrating tablet Take 15 mg by mouth daily.     . mirtazapine (REMERON) 15 MG tablet Take 15 mg by mouth at bedtime.      Marland Kitchen PRAVASTATIN SODIUM PO Take by mouth.    . traMADol (ULTRAM) 50 MG tablet Take 50 mg by mouth 3 (three) times daily.     Marland Kitchen zolpidem (AMBIEN) 10 MG tablet Take 10 mg by mouth at bedtime as needed for sleep.      No current facility-administered medications for this visit.      Physical Exam  Blood pressure 127/83, pulse 70, temperature 97.8 F (36.6 C), height 5\' 10"  (1.778 m), weight 230 lb (104.3 kg).  Constitutional: overall normal hygiene, normal nutrition, well developed, normal grooming, normal body habitus. Assistive device:none  Musculoskeletal: gait and station Limp none, muscle tone and strength are normal, no tremors or atrophy is present.  .  Neurological: coordination overall normal.  Deep tendon reflex/nerve stretch intact.  Sensation normal.  Cranial nerves II-XII intact.   Skin:   Normal overall no scars, lesions, ulcers or rashes. No psoriasis.  Psychiatric: Alert and oriented x 3.  Recent memory intact, remote memory unclear.  Normal mood and affect. Well groomed.  Good eye contact.  Cardiovascular: overall no swelling, no varicosities, no edema bilaterally, normal temperatures of the legs and arms, no clubbing, cyanosis and good capillary refill.  Lymphatic: palpation is normal.  His right heel area at distal Achilles insertion has a firm callus of the skin where the area has been rubbing against something.  I looked at his shoe and he has a worn area in the top back of the shoe corresponding to the location of the place on his heel.  It has rubbed an area here.  He agrees.  We talked about shoe wear and getting new shoes.  He will.  The callus will eventually go down.  I have recommended ice and Aspercreme to the area.  The patient has been educated about the nature of the problem(s) and counseled on treatment options.  The patient appeared to understand what I have discussed and is in agreement with it.  Encounter Diagnosis  Name Primary?  . Pain in joint  involving right ankle and foot Yes    PLAN Call if any problems.  Precautions discussed.  Continue current medications.   Return to clinic prn   Electronically Signed Sanjuana Kava, MD 9/5/201810:47 AM

## 2017-05-23 ENCOUNTER — Ambulatory Visit: Payer: BLUE CROSS/BLUE SHIELD | Admitting: Orthopaedic Surgery

## 2017-05-23 ENCOUNTER — Encounter: Payer: Self-pay | Admitting: Orthopaedic Surgery

## 2017-05-23 VITALS — BP 144/82 | HR 78 | Temp 98.3°F | Ht 70.0 in | Wt 229.0 lb

## 2017-05-23 DIAGNOSIS — M25571 Pain in right ankle and joints of right foot: Secondary | ICD-10-CM | POA: Diagnosis not present

## 2017-05-23 NOTE — Progress Notes (Signed)
Patient DU:KGURKY Ryan [name], male DOB:07/22/1955, 61 y.o. HCW:[id number]  Chief Complaint  Patient presents with  . Follow-up    Right Foot Pain    HPI  Ryan [name] is a 61 y.o. male who has heel pain around the distal Achilles tendon from shoe wear and a callus in this area that causes the pain.  I have told him to change shoe wear.  He has and this has helped.  It will take some time for the callus to disappear.  He knows the type of shoe or boot to wear.  He will do this.  I will see him as needed.  He has no redness now. HPI  Body mass index is 32.86 kg/m.  ROS  Review of Systems  HENT: Negative for congestion.   Respiratory: Positive for shortness of breath. Negative for cough.   Cardiovascular: Negative for leg swelling.  Endocrine: Negative for cold intolerance.  Musculoskeletal: Positive for gait problem and myalgias.  Allergic/Immunologic: Negative for environmental allergies.    Past Medical History:  Diagnosis Date  . Anemia   . Arthritis   . Asthma   . COPD (chronic obstructive pulmonary disease) ([place])   . Depression   . GERD (gastroesophageal reflux disease)   . High cholesterol    diet controlled  . Hypertension    diet controlled  . Sleep apnea    CPAP    Past Surgical History:  Procedure Laterality Date  . CHOLECYSTECTOMY    . COLONOSCOPY  03/15/2005   Dr. Rourk:Rectal polyps (diminutive), status post cold biopsy/removal/normal colon. hyperplastic  . KNEE ARTHROSCOPY Right     Family History  Problem Relation Age of Onset  . Cancer Mother        unsure primary  . Colon cancer Neg Hx     Social History Social History   Tobacco Use  . Smoking status: Former Smoker    Packs/day: 2.00    Years: 35.00    Pack years: 70.00    Types: Cigarettes    Last attempt to quit: 10/16/2010    Years since quitting: 6.6  . Smokeless tobacco: Never Used  Substance Use Topics  . Alcohol use: Yes    Alcohol/week: 0.0 oz    Comment: couple of drinks of  borboun every night  . Drug use: No    Allergies  Allergen Reactions  . Latex   . Tiotropium Bromide Monohydrate Other (See Comments)    Patient reports blurred vision after taking Spiriva    Current Outpatient Medications  Medication Sig Dispense Refill  . acetaminophen (TYLENOL) 500 MG tablet Take 500 mg by mouth 3 (three) times daily.     . fluticasone-salmeterol (ADVAIR HFA) 115-21 MCG/ACT inhaler Inhale 1-2 puffs into the lungs 2 (two) times daily. 2 puffs in the morning and 1 puff in the evening.    . gabapentin (NEURONTIN) 100 MG capsule Take 100 mg by mouth 3 (three) times daily.     Marland Kitchen HYDROcodone-acetaminophen (NORCO/VICODIN) 5-325 MG tablet Take 1 tablet by mouth every 4 (four) hours as needed. 15 tablet 0  . lansoprazole (PREVACID SOLUTAB) 15 MG disintegrating tablet Take 15 mg by mouth daily.     . mirtazapine (REMERON) 15 MG tablet Take 15 mg by mouth at bedtime.     Marland Kitchen PRAVASTATIN SODIUM PO Take by mouth.    . traMADol (ULTRAM) 50 MG tablet Take 50 mg by mouth 3 (three) times daily.     Marland Kitchen zolpidem (AMBIEN) 10 MG tablet  Take 10 mg by mouth at bedtime as needed for sleep.      No current facility-administered medications for this visit.      Physical Exam  Blood pressure (!) 144/82, pulse 78, temperature 98.3 F (36.8 C), height 5\' 10"  (1.778 m), weight 229 lb (103.9 kg).  Constitutional: overall normal hygiene, normal nutrition, well developed, normal grooming, normal body habitus. Assistive device:none  Musculoskeletal: gait and station Limp none, muscle tone and strength are normal, no tremors or atrophy is present.  .  Neurological: coordination overall normal.  Deep tendon reflex/nerve stretch intact.  Sensation normal.  Cranial nerves II-XII intact.   Skin:   Normal overall no scars, lesions, ulcers or rashes. No psoriasis.  Psychiatric: Alert and oriented x 3.  Recent memory intact, remote memory unclear.  Normal mood and affect. Well groomed.  Good eye  contact.  Cardiovascular: overall no swelling, no varicosities, no edema bilaterally, normal temperatures of the legs and arms, no clubbing, cyanosis and good capillary refill.  Lymphatic: palpation is normal.  All other systems reviewed and are negative   The callus or defect of the distal Achilles tendon on the right is less intense and has no swelling or redness.  It is still present and slightly tender. ROM of the right ankle is full.  NV intact. Gait is normal.  The patient has been educated about the nature of the problem(s) and counseled on treatment options.  The patient appeared to understand what I have discussed and is in agreement with it.  Encounter Diagnosis  Name Primary?  . Pain in joint involving right ankle and foot Yes    PLAN Call if any problems.  Precautions discussed.  Continue current medications.   Return to clinic PRN   Electronically Signed Sanjuana Kava, MD 11/13/201810:39 AM

## 2017-05-30 DIAGNOSIS — Z9989 Dependence on other enabling machines and devices: Secondary | ICD-10-CM | POA: Diagnosis not present

## 2017-05-30 DIAGNOSIS — G4733 Obstructive sleep apnea (adult) (pediatric): Secondary | ICD-10-CM | POA: Diagnosis not present

## 2017-05-30 DIAGNOSIS — R0602 Shortness of breath: Secondary | ICD-10-CM | POA: Diagnosis not present

## 2017-05-30 DIAGNOSIS — J449 Chronic obstructive pulmonary disease, unspecified: Secondary | ICD-10-CM | POA: Diagnosis not present

## 2017-05-30 DIAGNOSIS — G4730 Sleep apnea, unspecified: Secondary | ICD-10-CM | POA: Diagnosis not present

## 2017-05-30 DIAGNOSIS — R0609 Other forms of dyspnea: Secondary | ICD-10-CM | POA: Diagnosis not present

## 2017-07-11 DIAGNOSIS — A770 Spotted fever due to Rickettsia rickettsii: Secondary | ICD-10-CM

## 2017-07-11 HISTORY — DX: Spotted fever due to Rickettsia rickettsii: A77.0

## 2017-07-18 ENCOUNTER — Encounter: Payer: Self-pay | Admitting: Internal Medicine

## 2017-08-23 DIAGNOSIS — Z125 Encounter for screening for malignant neoplasm of prostate: Secondary | ICD-10-CM | POA: Diagnosis not present

## 2017-08-23 DIAGNOSIS — M1990 Unspecified osteoarthritis, unspecified site: Secondary | ICD-10-CM | POA: Diagnosis not present

## 2017-08-23 DIAGNOSIS — I10 Essential (primary) hypertension: Secondary | ICD-10-CM | POA: Diagnosis not present

## 2017-08-23 DIAGNOSIS — Z79899 Other long term (current) drug therapy: Secondary | ICD-10-CM | POA: Diagnosis not present

## 2017-08-25 DIAGNOSIS — J329 Chronic sinusitis, unspecified: Secondary | ICD-10-CM | POA: Diagnosis not present

## 2017-08-25 DIAGNOSIS — J441 Chronic obstructive pulmonary disease with (acute) exacerbation: Secondary | ICD-10-CM | POA: Diagnosis not present

## 2017-08-30 ENCOUNTER — Ambulatory Visit (INDEPENDENT_AMBULATORY_CARE_PROVIDER_SITE_OTHER): Payer: BLUE CROSS/BLUE SHIELD

## 2017-08-30 DIAGNOSIS — Z86010 Personal history of colonic polyps: Secondary | ICD-10-CM

## 2017-08-30 NOTE — Progress Notes (Signed)
Pt came to the office for a nurse visit for a 3 year surveillance tcs. Pt is taking gabapentin, tramadol 50mg  tid, and ambien 10mg  every night. He said he cannot go to sleep without it. Spoke with EG, pt needs to be done in the OR. I explained this to the pt and that he needs an office visit.  Pt was fine with this. Erline Levine has scheduled ov for the pt.

## 2017-08-31 DIAGNOSIS — Z6834 Body mass index (BMI) 34.0-34.9, adult: Secondary | ICD-10-CM | POA: Diagnosis not present

## 2017-08-31 DIAGNOSIS — E785 Hyperlipidemia, unspecified: Secondary | ICD-10-CM | POA: Diagnosis not present

## 2017-08-31 DIAGNOSIS — I10 Essential (primary) hypertension: Secondary | ICD-10-CM | POA: Diagnosis not present

## 2017-10-24 ENCOUNTER — Ambulatory Visit: Payer: BLUE CROSS/BLUE SHIELD | Admitting: Gastroenterology

## 2017-10-24 ENCOUNTER — Other Ambulatory Visit: Payer: Self-pay | Admitting: *Deleted

## 2017-10-24 ENCOUNTER — Encounter: Payer: Self-pay | Admitting: Gastroenterology

## 2017-10-24 ENCOUNTER — Encounter: Payer: Self-pay | Admitting: *Deleted

## 2017-10-24 VITALS — BP 154/90 | HR 68 | Temp 97.2°F | Ht 70.0 in | Wt 230.6 lb

## 2017-10-24 DIAGNOSIS — Z860100 Personal history of colon polyps, unspecified: Secondary | ICD-10-CM

## 2017-10-24 MED ORDER — CLENPIQ 10-3.5-12 MG-GM -GM/160ML PO SOLN: 1.0000 | Freq: Once | ORAL | 0 refills | Status: AC

## 2017-10-24 NOTE — Patient Instructions (Signed)
We have scheduled you for a colonoscopy in the near future with Dr. Gala Romney.  Please send a copy of the most recent labs if you are able. Thank you!  It was a pleasure to see you today. I strive to create trusting relationships with patients to provide genuine, compassionate, and quality care. I value your feedback. If you receive a survey regarding your visit,  I greatly appreciate you taking time to fill this out.   Annitta Needs, PhD, ANP-BC [facility]

## 2017-10-24 NOTE — Progress Notes (Signed)
Primary Care Physician:  Asencion Noble, MD  Primary GI: Dr. Gala Romney   Chief Complaint  Patient presents with  . Consult    colonoscopy    HPI:   Ryan [name] is a 62 y.o. 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 Capsule study also completed without obvious mass, felt to have possible erosions in setting of NSAIDs contributing to [place] presentation. Due to 1 cm sessile serrated polyp in 2016, he is due for surveillance. He was last seen in April 2016.   Labs from Dr. Willey Blade reviewed from Jan 2018 and Feb 2019. Jan 2018 Hgb 14.6. Feb 2019 Hgb 14.8. Cr most recently 1.01. Tbili 0.4, Alk Phos 69, AST 21, ALT 21.   Nephew passed away from colon cancer ate age 18. No first-degree relatives with any history of colon cancer. No abdominal pain, N/V. Good appetite. No dysphagia. Avoiding NSAIDs.     Past Medical History:  Diagnosis Date  . Anemia   . Arthritis   . Asthma   . COPD (chronic obstructive pulmonary disease) ([place])   . Depression   . GERD (gastroesophageal reflux disease)   . High cholesterol    diet controlled  . Hypertension    diet controlled  . Sleep apnea    CPAP    Past Surgical History:  Procedure Laterality Date  . CATARACT EXTRACTION  2018  . CATARACT EXTRACTION W/PHACO Right 02/23/2015   Procedure: CATARACT EXTRACTION PHACO AND INTRAOCULAR LENS PLACEMENT (IOC);  Surgeon: Williams Che, MD;  Location: [facility];  Service: Ophthalmology;  Laterality: Right;  CDE:5.02  . CHOLECYSTECTOMY    . COLONOSCOPY  03/15/2005   Dr. Rourk:Rectal polyps (diminutive), status post cold biopsy/removal/normal colon. hyperplastic  . COLONOSCOPY N/A 08/27/2014   Dr. Gala Romney: Multiple rectal and colonic polyps removed, one which was a 1 cm sessile polyp. Cecal AVMS ablated as described above. colonic Diverticulosis. Path with rectal hyperplastic polyp, ascending colon polyps with benign colonic mucosa, sessile serrated  polyp with associated lipoma. Recommendation for surveillance colonoscopy in 3 years.   . ESOPHAGOGASTRODUODENOSCOPY N/A 08/27/2014   Dr. Rourk:Tiny distal esophageal erosions consistent with mild erosive reflux esophaigitis. Hiatal hernia. Antral erosions status post gastric biopsy. mild chronic gastritis, negative H.pylori  . GIVENS CAPSULE STUDY N/A 11/05/2014   Procedure: GIVENS CAPSULE STUDY;  Surgeon: Daneil Dolin, MD;  Location: [facility];  Service: Endoscopy;  Laterality: N/A;  0800  . KNEE ARTHROSCOPY Right     Current Outpatient Medications  Medication Sig Dispense Refill  . acetaminophen (TYLENOL) 500 MG tablet Take 500 mg by mouth 3 (three) times daily.     . Coenzyme Q10 (COQ10) 100 MG CAPS Take by mouth daily.    . fluticasone-salmeterol (ADVAIR HFA) 115-21 MCG/ACT inhaler Inhale 1-2 puffs into the lungs 2 (two) times daily. 2 puffs in the morning and 1 puff in the evening.    . gabapentin (NEURONTIN) 100 MG capsule Take 100 mg by mouth 3 (three) times daily.     . lansoprazole (PREVACID SOLUTAB) 15 MG disintegrating tablet Take 15 mg by mouth daily.     . mirtazapine (REMERON) 15 MG tablet Take 15 mg by mouth at bedtime.     Marland Kitchen PRAVASTATIN SODIUM PO Take by mouth daily.     . traMADol (ULTRAM) 50 MG tablet Take 50 mg by mouth 3 (three) times daily.     Marland Kitchen zolpidem (AMBIEN) 10  MG tablet Take 10 mg by mouth at bedtime as needed for sleep.      No current facility-administered medications for this visit.     Allergies as of 10/24/2017 - Review Complete 10/24/2017  Allergen Reaction Noted  . Latex  11/30/2010  . Tiotropium bromide monohydrate Other (See Comments) 08/18/2014    Family History  Problem Relation Age of Onset  . Cancer Mother        unsure primary  . Colon cancer Neg Hx     Social History   Socioeconomic History  . Marital status: Married    Spouse name: Not on file  . Number of children: Not on file  . Years of education: Not on file  . Highest  education level: Not on file  Occupational History  . Occupation: [employer] therapist  . Financial resource strain: Not on file  . Food insecurity:    Worry: Not on file    Inability: Not on file  . Transportation needs:    Medical: Not on file    Non-medical: Not on file  Tobacco Use  . Smoking status: Former Smoker    Packs/day: 2.00    Years: 35.00    Pack years: 70.00    Types: Cigarettes    Last attempt to quit: 10/16/2010    Years since quitting: 7.0  . Smokeless tobacco: Never Used  Substance and Sexual Activity  . Alcohol use: Yes    Alcohol/week: 0.0 oz    Comment: couple of drinks of borboun every night  . Drug use: No  . Sexual activity: Yes    Birth control/protection: None  Lifestyle  . Physical activity:    Days per week: Not on file    Minutes per session: Not on file  . Stress: Not on file  Relationships  . Social connections:    Talks on phone: Not on file    Gets together: Not on file    Attends religious service: Not on file    Active member of club or organization: Not on file    Attends meetings of clubs or organizations: Not on file    Relationship status: Not on file  Other Topics Concern  . Not on file  Social History Narrative  . Not on file    Review of Systems: Gen: Denies fever, chills, anorexia. Denies fatigue, weakness, weight loss.  CV: Denies chest pain, palpitations, syncope, peripheral edema, and claudication. Resp: Denies dyspnea at rest, cough, wheezing, coughing up blood, and pleurisy. GI: see HPI  Derm: Denies rash, itching, dry skin Psych: Denies depression, anxiety, memory loss, confusion. No homicidal or suicidal ideation.  Heme: Denies bruising, bleeding, and enlarged lymph nodes.  Physical Exam: BP (!) 154/90   Pulse 68   Temp (!) 97.2 F (36.2 C) (Oral)   Ht '5\' 10"'$  (1.778 m)   Wt 230 lb 9.6 oz (104.6 kg)   BMI 33.09 kg/m  General:   Alert and oriented. No distress noted. Pleasant and  cooperative.  Head:  Normocephalic and atraumatic. Eyes:  Conjuctiva clear without scleral icterus. Mouth:  Oral mucosa pink and moist.  Lungs: clear to auscultation bilaterally  Cardiac: S1 S2 present without murmurs  Abdomen:  +BS, soft, non-tender and non-distended. No rebound or guarding. No HSM or masses noted. Msk:  Symmetrical without gross deformities. Normal posture. Extremities:  Without edema. Neurologic:  Alert and  oriented x4 Psych:  Alert and cooperative. Normal mood and affect.

## 2017-10-25 ENCOUNTER — Telehealth: Payer: Self-pay | Admitting: *Deleted

## 2017-10-25 NOTE — Telephone Encounter (Signed)
[employer] and spoke with Beverely Low. Was advised no PA required for TCS.

## 2017-10-25 NOTE — Telephone Encounter (Signed)
Pre-op scheduled for 12/18/17 at 12:45pm. Letter mailed. LMOVM

## 2017-10-25 NOTE — Progress Notes (Signed)
cc'ed to pcp °

## 2017-10-25 NOTE — Assessment & Plan Note (Signed)
62 year old male presenting with history of multiple colon polyps, 1 cm sessile serrated polyp in 2016. Due for 3-year-surveillance. No concerning upper or lower GI symptoms. Recent CBC reviewed. No anemia. History of IDA in 2016 thoroughly evaluated as noted in HPI and resolved. Remains off NSAIDs.  Proceed with TCS with Dr. Gala Romney in near future: the risks, benefits, and alternatives have been discussed with the patient in detail. The patient states understanding and desires to proceed. Propofol due to polypharmacy and history of ETOH use

## 2017-11-13 DIAGNOSIS — G4733 Obstructive sleep apnea (adult) (pediatric): Secondary | ICD-10-CM | POA: Diagnosis not present

## 2017-11-23 DIAGNOSIS — H01022 Squamous blepharitis right lower eyelid: Secondary | ICD-10-CM | POA: Diagnosis not present

## 2017-11-23 DIAGNOSIS — H01024 Squamous blepharitis left upper eyelid: Secondary | ICD-10-CM | POA: Diagnosis not present

## 2017-11-23 DIAGNOSIS — H43391 Other vitreous opacities, right eye: Secondary | ICD-10-CM | POA: Diagnosis not present

## 2017-11-23 DIAGNOSIS — H01021 Squamous blepharitis right upper eyelid: Secondary | ICD-10-CM | POA: Diagnosis not present

## 2017-12-14 NOTE — Patient Instructions (Signed)
Ryan [name]  12/14/2017     @PREFPERIOPPHARMACY @   Your procedure is scheduled on  12/25/2017 .  Report to [facility] at  1130   A.M.  Call this number if you have problems the morning of surgery:  [phone]   Remember:  No food after midnight.  You may drink clear liquids until (follow the instructions given to you by Dr Roseanne Kaufman office) .  Clear liquids allowed are:                    Water, Juice (non-citric and without pulp), Carbonated beverages, Clear Tea, Black Coffee only, Plain Jell-O only, Gatorade and Plain Popsicles only    Take these medicines the morning of surgery with A SIP OF WATER Gabapentin, lansoprazole, ultram.    Do not wear jewelry, make-up or nail polish.  Do not wear lotions, powders, or perfumes, or deodorant.  Do not shave 48 hours prior to surgery.  Men may shave face and neck.  Do not bring valuables to the hospital.  [facility] is not responsible for any belongings or valuables.  Contacts, dentures or bridgework may not be worn into surgery.  Leave your suitcase in the car.  After surgery it may be brought to your room.  For patients admitted to the hospital, discharge time will be determined by your treatment team.  Patients discharged the day of surgery will not be allowed to drive home.   Name and phone number of your driver:   Family Special instructions:  Follow the diet and prep instructions given to you by Dr Roseanne Kaufman office.  Please read over the following fact sheets that you were given. Anesthesia Post-op Instructions and Care and Recovery After Surgery       Colonoscopy, Adult A colonoscopy is an exam to look at the large intestine. It is done to check for problems, such as:  Lumps (tumors).  Growths (polyps).  Swelling (inflammation).  Bleeding.  What happens before the procedure? Eating and drinking Follow instructions from your doctor about eating and drinking. These instructions may include:  A  few days before the procedure - follow a low-fiber diet. ? Avoid nuts. ? Avoid seeds. ? Avoid dried fruit. ? Avoid raw fruits. ? Avoid vegetables.  1-3 days before the procedure - follow a clear liquid diet. Avoid liquids that have red or purple dye. Drink only clear liquids, such as: ? Clear broth or bouillon. ? Black coffee or tea. ? Clear juice. ? Clear soft drinks or sports drinks. ? Gelatin dessert. ? Popsicles.  On the day of the procedure - do not eat or drink anything during the 2 hours before the procedure.  Bowel prep If you were prescribed an oral bowel prep:  Take it as told by your doctor. Starting the day before your procedure, you will need to drink a lot of liquid. The liquid will cause you to poop (have bowel movements) until your poop is almost clear or light green.  If your skin or butt gets irritated from diarrhea, you may: ? Wipe the area with wipes that have medicine in them, such as adult wet wipes with aloe and vitamin E. ? Put something on your skin that soothes the area, such as petroleum jelly.  If you throw up (vomit) while drinking the bowel prep, take a break for up to 60 minutes. Then begin the bowel prep again. If you keep throwing up  and you cannot take the bowel prep without throwing up, call your doctor.  General instructions  Ask your doctor about changing or stopping your normal medicines. This is important if you take diabetes medicines or blood thinners.  Plan to have someone take you home from the hospital or clinic. What happens during the procedure?  An IV tube may be put into one of your veins.  You will be given medicine to help you relax (sedative).  To reduce your risk of infection: ? Your doctors will wash their hands. ? Your anal area will be washed with soap.  You will be asked to lie on your side with your knees bent.  Your doctor will get a long, thin, flexible tube ready. The tube will have a camera and a light on the  end.  The tube will be put into your anus.  The tube will be gently put into your large intestine.  Air will be delivered into your large intestine to keep it open. You may feel some pressure or cramping.  The camera will be used to take photos.  A small tissue sample may be removed from your body to be looked at under a microscope (biopsy). If any possible problems are found, the tissue will be sent to a lab for testing.  If small growths are found, your doctor may remove them and have them checked for cancer.  The tube that was put into your anus will be slowly removed. The procedure may vary among doctors and hospitals. What happens after the procedure?  Your doctor will check on you often until the medicines you were given have worn off.  Do not drive for 24 hours after the procedure.  You may have a small amount of blood in your poop.  You may pass gas.  You may have mild cramps or bloating in your belly (abdomen).  It is up to you to get the results of your procedure. Ask your doctor, or the department performing the procedure, when your results will be ready. This information is not intended to replace advice given to you by your health care provider. Make sure you discuss any questions you have with your health care provider. Document Released: 07/30/2010 Document Revised: 04/27/2016 Document Reviewed: 09/08/2015 Elsevier Interactive Patient Education  2017 [employer]  Colonoscopy, Adult, Care After This sheet gives you information about how to care for yourself after your procedure. Your health care provider may also give you more specific instructions. If you have problems or questions, contact your health care provider. What can I expect after the procedure? After the procedure, it is common to have:  A small amount of blood in your stool for 24 hours after the procedure.  Some gas.  Mild abdominal cramping or bloating.  Follow these instructions at  home: General instructions   For the first 24 hours after the procedure: ? Do not drive or use machinery. ? Do not sign important documents. ? Do not drink alcohol. ? Do your regular daily activities at a slower pace than normal. ? Eat soft, easy-to-digest foods. ? Rest often.  Take over-the-counter or prescription medicines only as told by your health care provider.  It is up to you to get the results of your procedure. Ask your health care provider, or the department performing the procedure, when your results will be ready. Relieving cramping and bloating  Try walking around when you have cramps or feel bloated.  Apply heat to your abdomen as  told by your health care provider. Use a heat source that your health care provider recommends, such as a moist heat pack or a heating pad. ? Place a towel between your skin and the heat source. ? Leave the heat on for 20-30 minutes. ? Remove the heat if your skin turns bright red. This is especially important if you are unable to feel pain, heat, or cold. You may have a greater risk of getting burned. Eating and drinking  Drink enough fluid to keep your urine clear or pale yellow.  Resume your normal diet as instructed by your health care provider. Avoid heavy or fried foods that are hard to digest.  Avoid drinking alcohol for as long as instructed by your health care provider. Contact a health care provider if:  You have blood in your stool 2-3 days after the procedure. Get help right away if:  You have more than a small spotting of blood in your stool.  You pass large blood clots in your stool.  Your abdomen is swollen.  You have nausea or vomiting.  You have a fever.  You have increasing abdominal pain that is not relieved with medicine. This information is not intended to replace advice given to you by your health care provider. Make sure you discuss any questions you have with your health care provider. Document Released:  02/09/2004 Document Revised: 03/21/2016 Document Reviewed: 09/08/2015 Elsevier Interactive Patient Education  2018 [employer] Anesthesia is a term that refers to techniques, procedures, and medicines that help a person stay safe and comfortable during a medical procedure. Monitored anesthesia care, or sedation, is one type of anesthesia. Your anesthesia specialist may recommend sedation if you will be having a procedure that does not require you to be unconscious, such as:  Cataract surgery.  A dental procedure.  A biopsy.  A colonoscopy.  During the procedure, you may receive a medicine to help you relax (sedative). There are three levels of sedation:  Mild sedation. At this level, you may feel awake and relaxed. You will be able to follow directions.  Moderate sedation. At this level, you will be sleepy. You may not remember the procedure.  Deep sedation. At this level, you will be asleep. You will not remember the procedure.  The more medicine you are given, the deeper your level of sedation will be. Depending on how you respond to the procedure, the anesthesia specialist may change your level of sedation or the type of anesthesia to fit your needs. An anesthesia specialist will monitor you closely during the procedure. Let your health care provider know about:  Any allergies you have.  All medicines you are taking, including vitamins, herbs, eye drops, creams, and over-the-counter medicines.  Any use of steroids (by mouth or as a cream).  Any problems you or family members have had with sedatives and anesthetic medicines.  Any blood disorders you have.  Any surgeries you have had.  Any medical conditions you have, such as sleep apnea.  Whether you are pregnant or may be pregnant.  Any use of cigarettes, alcohol, or street drugs. What are the risks? Generally, this is a safe procedure. However, problems may occur, including:  Getting too  much medicine (oversedation).  Nausea.  Allergic reaction to medicines.  Trouble breathing. If this happens, a breathing tube may be used to help with breathing. It will be removed when you are awake and breathing on your own.  Heart trouble.  Lung trouble.  Before the procedure Staying hydrated Follow instructions from your health care provider about hydration, which may include:  Up to 2 hours before the procedure - you may continue to drink clear liquids, such as water, clear fruit juice, black coffee, and plain tea.  Eating and drinking restrictions Follow instructions from your health care provider about eating and drinking, which may include:  8 hours before the procedure - stop eating heavy meals or foods such as meat, fried foods, or fatty foods.  6 hours before the procedure - stop eating light meals or foods, such as toast or cereal.  6 hours before the procedure - stop drinking milk or drinks that contain milk.  2 hours before the procedure - stop drinking clear liquids.  Medicines Ask your health care provider about:  Changing or stopping your regular medicines. This is especially important if you are taking diabetes medicines or blood thinners.  Taking medicines such as aspirin and ibuprofen. These medicines can thin your blood. Do not take these medicines before your procedure if your health care provider instructs you not to.  Tests and exams  You will have a physical exam.  You may have blood tests done to show: ? How well your kidneys and liver are working. ? How well your blood can clot.  General instructions  Plan to have someone take you home from the hospital or clinic.  If you will be going home right after the procedure, plan to have someone with you for 24 hours.  What happens during the procedure?  Your blood pressure, heart rate, breathing, level of pain and overall condition will be monitored.  An IV tube will be inserted into one of  your veins.  Your anesthesia specialist will give you medicines as needed to keep you comfortable during the procedure. This may mean changing the level of sedation.  The procedure will be performed. After the procedure  Your blood pressure, heart rate, breathing rate, and blood oxygen level will be monitored until the medicines you were given have worn off.  Do not drive for 24 hours if you received a sedative.  You may: ? Feel sleepy, clumsy, or nauseous. ? Feel forgetful about what happened after the procedure. ? Have a sore throat if you had a breathing tube during the procedure. ? Vomit. This information is not intended to replace advice given to you by your health care provider. Make sure you discuss any questions you have with your health care provider. Document Released: 03/23/2005 Document Revised: 12/04/2015 Document Reviewed: 10/18/2015 Elsevier Interactive Patient Education  2018 [place], Care After These instructions provide you with information about caring for yourself after your procedure. Your health care provider may also give you more specific instructions. Your treatment has been planned according to current medical practices, but problems sometimes occur. Call your health care provider if you have any problems or questions after your procedure. What can I expect after the procedure? After your procedure, it is common to:  Feel sleepy for several hours.  Feel clumsy and have poor balance for several hours.  Feel forgetful about what happened after the procedure.  Have poor judgment for several hours.  Feel nauseous or vomit.  Have a sore throat if you had a breathing tube during the procedure.  Follow these instructions at home: For at least 24 hours after the procedure:   Do not: ? Participate in activities in which you could fall or become injured. ? Drive. ?  Use heavy machinery. ? Drink alcohol. ? Take sleeping pills  or medicines that cause drowsiness. ? Make important decisions or sign legal documents. ? Take care of children on your own.  Rest. Eating and drinking  Follow the diet that is recommended by your health care provider.  If you vomit, drink water, juice, or soup when you can drink without vomiting.  Make sure you have little or no nausea before eating solid foods. General instructions  Have a responsible adult stay with you until you are awake and alert.  Take over-the-counter and prescription medicines only as told by your health care provider.  If you smoke, do not smoke without supervision.  Keep all follow-up visits as told by your health care provider. This is important. Contact a health care provider if:  You keep feeling nauseous or you keep vomiting.  You feel light-headed.  You develop a rash.  You have a fever. Get help right away if:  You have trouble breathing. This information is not intended to replace advice given to you by your health care provider. Make sure you discuss any questions you have with your health care provider. Document Released: 10/18/2015 Document Revised: 02/17/2016 Document Reviewed: 10/18/2015 Elsevier Interactive Patient Education  [employer].

## 2017-12-18 ENCOUNTER — Encounter (HOSPITAL_COMMUNITY): Payer: Self-pay

## 2017-12-18 ENCOUNTER — Other Ambulatory Visit: Payer: Self-pay

## 2017-12-18 ENCOUNTER — Encounter (HOSPITAL_COMMUNITY)
Admission: RE | Admit: 2017-12-18 | Discharge: 2017-12-18 | Disposition: A | Discharge status: Home / Self Care | Payer: BLUE CROSS/BLUE SHIELD | Source: Ambulatory Visit | Attending: Internal Medicine | Admitting: Internal Medicine

## 2017-12-18 DIAGNOSIS — Z01810 Encounter for preprocedural cardiovascular examination: Secondary | ICD-10-CM | POA: Diagnosis not present

## 2017-12-18 DIAGNOSIS — Z01812 Encounter for preprocedural laboratory examination: Secondary | ICD-10-CM | POA: Diagnosis not present

## 2017-12-18 LAB — CBC WITH DIFFERENTIAL/PLATELET
BASOS ABS: 0 10*3/uL (ref 0.0–0.1)
BASOS PCT: 0 %
EOS PCT: 2 %
Eosinophils Absolute: 0.1 10*3/uL (ref 0.0–0.7)
HCT: 44.7 % (ref 39.0–52.0)
Hemoglobin: 14.2 g/dL (ref 13.0–17.0)
Lymphocytes Relative: 31 %
Lymphs Abs: 1.9 10*3/uL (ref 0.7–4.0)
MCH: 30.9 pg (ref 26.0–34.0)
MCHC: 31.8 g/dL (ref 30.0–36.0)
MCV: 97.4 fL (ref 78.0–100.0)
Monocytes Absolute: 0.4 10*3/uL (ref 0.1–1.0)
Monocytes Relative: 7 %
Neutro Abs: 3.5 10*3/uL (ref 1.7–7.7)
Neutrophils Relative %: 60 %
PLATELETS: 214 10*3/uL (ref 150–400)
RBC: 4.59 MIL/uL (ref 4.22–5.81)
RDW: 12.7 % (ref 11.5–15.5)
WBC: 6 10*3/uL (ref 4.0–10.5)

## 2017-12-18 LAB — BASIC METABOLIC PANEL
ANION GAP: 6 (ref 5–15)
BUN: 15 mg/dL (ref 6–20)
CALCIUM: 9.4 mg/dL (ref 8.9–10.3)
CO2: 26 mmol/L (ref 22–32)
Chloride: 110 mmol/L (ref 101–111)
Creatinine, Ser: 0.91 mg/dL (ref 0.61–1.24)
GFR calc non Af Amer: >60 mL/min (ref >60)
Glucose, Bld: 97 mg/dL (ref 65–99)
POTASSIUM: 4.3 mmol/L (ref 3.5–5.1)
SODIUM: 142 mmol/L (ref 135–145)

## 2017-12-25 ENCOUNTER — Ambulatory Visit (HOSPITAL_COMMUNITY)
Admission: RE | Admit: 2017-12-25 | Discharge: 2017-12-25 | Disposition: A | Discharge status: Home / Self Care | Payer: BLUE CROSS/BLUE SHIELD | Source: Ambulatory Visit | Attending: Internal Medicine | Admitting: Internal Medicine

## 2017-12-25 ENCOUNTER — Ambulatory Visit (HOSPITAL_COMMUNITY): Payer: BLUE CROSS/BLUE SHIELD | Admitting: Anesthesiology

## 2017-12-25 ENCOUNTER — Encounter (HOSPITAL_COMMUNITY)
Admission: RE | Disposition: A | Discharge status: Home / Self Care | Payer: Self-pay | Source: Ambulatory Visit | Attending: Internal Medicine

## 2017-12-25 ENCOUNTER — Encounter (HOSPITAL_COMMUNITY): Payer: Self-pay | Admitting: *Deleted

## 2017-12-25 ENCOUNTER — Other Ambulatory Visit: Payer: Self-pay

## 2017-12-25 DIAGNOSIS — E7800 Pure hypercholesterolemia, unspecified: Secondary | ICD-10-CM | POA: Diagnosis not present

## 2017-12-25 DIAGNOSIS — D122 Benign neoplasm of ascending colon: Secondary | ICD-10-CM | POA: Diagnosis not present

## 2017-12-25 DIAGNOSIS — G4730 Sleep apnea, unspecified: Secondary | ICD-10-CM | POA: Diagnosis not present

## 2017-12-25 DIAGNOSIS — K5730 Diverticulosis of large intestine without perforation or abscess without bleeding: Secondary | ICD-10-CM | POA: Diagnosis not present

## 2017-12-25 DIAGNOSIS — K219 Gastro-esophageal reflux disease without esophagitis: Secondary | ICD-10-CM | POA: Insufficient documentation

## 2017-12-25 DIAGNOSIS — Z79899 Other long term (current) drug therapy: Secondary | ICD-10-CM | POA: Diagnosis not present

## 2017-12-25 DIAGNOSIS — Z1211 Encounter for screening for malignant neoplasm of colon: Secondary | ICD-10-CM | POA: Insufficient documentation

## 2017-12-25 DIAGNOSIS — J449 Chronic obstructive pulmonary disease, unspecified: Secondary | ICD-10-CM | POA: Insufficient documentation

## 2017-12-25 DIAGNOSIS — K635 Polyp of colon: Secondary | ICD-10-CM | POA: Diagnosis not present

## 2017-12-25 DIAGNOSIS — I10 Essential (primary) hypertension: Secondary | ICD-10-CM | POA: Insufficient documentation

## 2017-12-25 DIAGNOSIS — Z87891 Personal history of nicotine dependence: Secondary | ICD-10-CM | POA: Insufficient documentation

## 2017-12-25 DIAGNOSIS — Z86010 Personal history of colonic polyps: Secondary | ICD-10-CM | POA: Diagnosis not present

## 2017-12-25 DIAGNOSIS — D124 Benign neoplasm of descending colon: Secondary | ICD-10-CM | POA: Diagnosis not present

## 2017-12-25 HISTORY — PX: POLYPECTOMY: SHX5525

## 2017-12-25 HISTORY — PX: COLONOSCOPY WITH PROPOFOL: SHX5780

## 2017-12-25 SURGERY — COLONOSCOPY WITH PROPOFOL
Anesthesia: General

## 2017-12-25 MED ORDER — FENTANYL CITRATE (PF) 100 MCG/2ML IJ SOLN: 25.0000 ug | INTRAMUSCULAR | Status: DC | PRN

## 2017-12-25 MED ORDER — PROPOFOL 10 MG/ML IV BOLUS
INTRAVENOUS | Status: AC
  Filled 2017-12-25: qty 20

## 2017-12-25 MED ORDER — HYDROCODONE-ACETAMINOPHEN 7.5-325 MG PO TABS: 1.0000 | ORAL_TABLET | Freq: Once | ORAL | Status: DC | PRN

## 2017-12-25 MED ORDER — LACTATED RINGERS IV SOLN: INTRAVENOUS | Status: DC

## 2017-12-25 MED ORDER — MIDAZOLAM HCL 2 MG/2ML IJ SOLN
INTRAMUSCULAR | Status: AC
  Filled 2017-12-25: qty 2

## 2017-12-25 MED ORDER — CHLORHEXIDINE GLUCONATE CLOTH 2 % EX PADS: 6.0000 | MEDICATED_PAD | Freq: Once | CUTANEOUS | Status: DC

## 2017-12-25 MED ORDER — LACTATED RINGERS IV SOLN
INTRAVENOUS | Status: DC | PRN
  Administered 2017-12-25: 13:00:00 via INTRAVENOUS

## 2017-12-25 MED ORDER — MIDAZOLAM HCL 5 MG/5ML IJ SOLN
INTRAMUSCULAR | Status: DC | PRN
  Administered 2017-12-25: 2 mg via INTRAVENOUS

## 2017-12-25 MED ORDER — PROPOFOL 500 MG/50ML IV EMUL
INTRAVENOUS | Status: DC | PRN
  Administered 2017-12-25: 14:00:00 via INTRAVENOUS
  Administered 2017-12-25: 150 ug/kg/min via INTRAVENOUS

## 2017-12-25 NOTE — Op Note (Signed)
[facility] Patient Name: Ryan [name] Procedure Date: 12/25/2017 12:19 PM MRN: [id number] Date of Birth: [birth year] Attending MD: Norvel Richards , MD CSN: [id number] Age: 62 Admit Type: Outpatient Procedure:                Colonoscopy Indications:              High risk colon cancer surveillance: Personal                            history of colonic polyps Providers:                Norvel Richards, MD, Hinton Rao, RN, Aram Candela Referring MD:             Asencion Noble Medicines:                Propofol per Anesthesia Complications:            No immediate complications. Estimated Blood Loss:      Procedure:                Pre-Anesthesia Assessment:                           - Prior to the procedure, a History and Physical                            was performed, and patient medications and                            allergies were reviewed. The patient's tolerance of                            previous anesthesia was also reviewed. The risks                            and benefits of the procedure and the sedation                            options and risks were discussed with the patient.                            All questions were answered, and informed consent                            was obtained. Prior Anticoagulants: The patient has                            taken no previous anticoagulant or antiplatelet                            agents. ASA Grade Assessment: II - A patient with                            mild systemic  disease. After reviewing the risks                            and benefits, the patient was deemed in                            satisfactory condition to undergo the procedure.                           After obtaining informed consent, the colonoscope                            was passed under direct vision. Throughout the                            procedure, the patient's blood pressure, pulse, and                             oxygen saturations were monitored continuously. The                            EC38-i10L ([id number]) scope was introduced through                            the and advanced to the the cecum, identified by                            appendiceal orifice and ileocecal valve. The                            colonoscopy was performed without difficulty. The                            patient tolerated the procedure well. The quality                            of the bowel preparation was adequate. The                            ileocecal valve, appendiceal orifice, and rectum                            were photographed. The entire colon was well                            visualized. The colonoscopy was performed without                            difficulty. The patient tolerated the procedure                            well. The quality of the bowel preparation was  adequate. Scope In: 1:32:26 PM Scope Out: 3:15:17 PM Scope Withdrawal Time: 0 hours 11 minutes 36 seconds  Total Procedure Duration: 0 hours 13 minutes 46 seconds  Findings:      The perianal and digital rectal examinations were normal.      Scattered small and large-mouthed diverticula were found in the entire       colon.      Three sessile polyps were found in the sigmoid colon and ascending       colon. The polyps were 4 to 7 mm in size. These polyps were removed with       a cold snare. Resection and retrieval were complete. Estimated blood       loss was minimal.      The exam was otherwise without abnormality on direct and retroflexion       views. Impression:               - Diverticulosis in the entire examined colon.                           - Three 4 to 7 mm polyps in the sigmoid colon and                            in the ascending colon, removed with a cold snare.                            Resected and retrieved.                           - The examination was  otherwise normal on direct                            and retroflexion views. Moderate Sedation:      Moderate (conscious) sedation was personally administered by an       anesthesia professional. The following parameters were monitored: oxygen       saturation, heart rate, blood pressure, respiratory rate, EKG, adequacy       of pulmonary ventilation, and response to care. Total physician       intraservice time was 19 minutes. Recommendation:           - Patient has a contact number available for                            emergencies. The signs and symptoms of potential                            delayed complications were discussed with the                            patient. Return to normal activities tomorrow.                            Written discharge instructions were provided to the                            patient.                           -  Resume previous diet.                           - Continue present medications.                           - Await pathology results.                           - Repeat colonoscopy date to be determined after                            pending pathology results are reviewed for                            surveillance based on pathology results.                           - Return to GI office (date not yet determined). Procedure Code(s):        --- Professional ---                           [phone], Colonoscopy, flexible; with removal of                            tumor(s), polyp(s), or other lesion(s) by snare                            technique Diagnosis Code(s):        --- Professional ---                           Z86.010, Personal history of colonic polyps                           D12.5, Benign neoplasm of sigmoid colon                           D12.2, Benign neoplasm of ascending colon                           K57.30, Diverticulosis of large intestine without                            perforation or abscess without  bleeding CPT copyright 2017 American Medical Association. All rights reserved. The codes documented in this report are preliminary and upon coder review may  be revised to meet current compliance requirements. Cristopher Estimable. Rourk, MD Norvel Richards, MD 12/25/2017 1:50:29 PM This report has been signed electronically. Number of Addenda: 0

## 2017-12-25 NOTE — Discharge Instructions (Signed)
Diverticulosis Diverticulosis is a condition that develops when small pouches (diverticula) form in the wall of the large intestine (colon). The colon is where water is absorbed and stool is formed. The pouches form when the inside layer of the colon pushes through weak spots in the outer layers of the colon. You may have a few pouches or many of them. What are the causes? The cause of this condition is not known. What increases the risk? The following factors may make you more likely to develop this condition:  Being older than age 62. Your risk for this condition increases with age. Diverticulosis is rare among people younger than age 62. By age 62, many people have it.  Eating a low-fiber diet.  Having frequent constipation.  Being overweight.  Not getting enough exercise.  Smoking.  Taking over-the-counter pain medicines, like aspirin and ibuprofen.  Having a family history of diverticulosis.  What are the signs or symptoms? In most people, there are no symptoms of this condition. If you do have symptoms, they may include:  Bloating.  Cramps in the abdomen.  Constipation or diarrhea.  Pain in the lower left side of the abdomen.  How is this diagnosed? This condition is most often diagnosed during an exam for other colon problems. Because diverticulosis usually has no symptoms, it often cannot be diagnosed independently. This condition may be diagnosed by:  Using a flexible scope to examine the colon (colonoscopy).  Taking an X-ray of the colon after dye has been put into the colon (barium enema).  Doing a CT scan.  How is this treated? You may not need treatment for this condition if you have never developed an infection related to diverticulosis. If you have had an infection before, treatment may include:  Eating a high-fiber diet. This may include eating more fruits, vegetables, and grains.  Taking a fiber supplement.  Taking a live bacteria supplement  (probiotic).  Taking medicine to relax your colon.  Taking antibiotic medicines.  Follow these instructions at home:  Drink 6-8 glasses of water or more each day to prevent constipation.  Try not to strain when you have a bowel movement.  If you have had an infection before: ? Eat more fiber as directed by your health care provider or your diet and nutrition specialist (dietitian). ? Take a fiber supplement or probiotic, if your health care provider approves.  Take over-the-counter and prescription medicines only as told by your health care provider.  If you were prescribed an antibiotic, take it as told by your health care provider. Do not stop taking the antibiotic even if you start to feel better.  Keep all follow-up visits as told by your health care provider. This is important. Contact a health care provider if:  You have pain in your abdomen.  You have bloating.  You have cramps.  You have not had a bowel movement in 3 days. Get help right away if:  Your pain gets worse.  Your bloating becomes very bad.  You have a fever or chills, and your symptoms suddenly get worse.  You vomit.  You have bowel movements that are bloody or black.  You have bleeding from your rectum. Summary  Diverticulosis is a condition that develops when small pouches (diverticula) form in the wall of the large intestine (colon).  You may have a few pouches or many of them.  This condition is most often diagnosed during an exam for other colon problems.  If you have had an  infection related to diverticulosis, treatment may include increasing the fiber in your diet, taking supplements, or taking medicines. °This information is not intended to replace advice given to you by your health care provider. Make sure you discuss any questions you have with your health care provider. °Document Released: 03/24/2004 Document Revised: 05/16/2016 Document Reviewed: 05/16/2016 °Elsevier Interactive  Patient Education © 2017 Else[employer] Inc[employer] °Colon Polyps °Polyps are tissue growths inside the body. Polyps can grow in many places, including the large intestine (colon). A polyp may be a round bump or a mushroom-shaped growth. You could have one polyp or several. °Most colon polyps are noncancerous (benign). However, some colon polyps can become cancerous over time. °What are the causes? °The exact cause of colon polyps is not known. °What increases the risk? °This condition is more likely to develop in people who: °· Have a family history of colon cancer or colon polyps. °· Are older than 62 or older than 45 if they are African American. °· Have inflammatory bowel disease, such as ulcerative colitis or Crohn disease. °· Are overweight. °· Smoke cigarettes. °· Do not get enough exercise. °· Drink too much alcohol. °· Eat a diet that is: °? High in fat and red meat. °? Low in fiber. °· Had childhood cancer that was treated with abdominal radiation. ° °What are the signs or symptoms? °Most polyps do not cause symptoms. If you have symptoms, they may include: °· Blood coming from your rectum when having a bowel movement. °· Blood in your stool. The stool may look dark red or black. °· A change in bowel habits, such as constipation or diarrhea. ° °How is this diagnosed? °This condition is diagnosed with a colonoscopy. This is a procedure that uses a lighted, flexible scope to look at the inside of your colon. °How is this treated? °Treatment for this condition involves removing any polyps that are found. Those polyps will then be tested for cancer. If cancer is found, your health care provider will talk to you about options for colon cancer treatment. °Follow these instructions at home: °Diet °· Eat plenty of fiber, such as fruits, vegetables, and whole grains. °· Eat foods that are high in calcium and vitamin D, such as milk, cheese, yogurt, eggs, liver, fish, and broccoli. °· Limit foods high in fat, red meats, and  processed meats, such as hot dogs, sausage, bacon, and lunch meats. °· Maintain a healthy weight, or lose weight if recommended by your health care provider. °General instructions °· Do not smoke cigarettes. °· Do not drink alcohol excessively. °· Keep all follow-up visits as told by your health care provider. This is important. This includes keeping regularly scheduled colonoscopies. Talk to your health care provider about when you need a colonoscopy. °· Exercise every day or as told by your health care provider. °Contact a health care provider if: °· You have new or worsening bleeding during a bowel movement. °· You have new or increased blood in your stool. °· You have a change in bowel habits. °· You unexpectedly lose weight. °This information is not intended to replace advice given to you by your health care provider. Make sure you discuss any questions you have with your health care provider. °Document Released: 03/23/2004 Document Revised: 12/03/2015 Document Reviewed: 05/18/2015 °Elsevier Interactive Patient Education © 2018 Elsevier Inc. ° °Colonoscopy °Discharge Instructions ° °Read the instructions outlined below and refer to this sheet in the next few weeks. These discharge instructions provide you with general information on caring   for yourself after you leave the hospital. Your doctor may also give you specific instructions. While your treatment has been planned according to the most current medical practices available, unavoidable complications occasionally occur. If you have any problems or questions after discharge, call Dr. Rourk at [phone]. °ACTIVITY °· You may resume your regular activity, but move at a slower pace for the next 24 hours.  °· Take frequent rest periods for the next 24 hours.  °· Walking will help get rid of the air and reduce the bloated feeling in your belly (abdomen).  °· No driving for 24 hours (because of the medicine (anesthesia) used during the test).   °· Do not sign any  important legal documents or operate any machinery for 24 hours (because of the anesthesia used during the test).  °NUTRITION °· Drink plenty of fluids.  °· You may resume your normal diet as instructed by your doctor.  °· Begin with a light meal and progress to your normal diet. Heavy or fried foods are harder to digest and may make you feel sick to your stomach (nauseated).  °· Avoid alcoholic beverages for 24 hours or as instructed.  °MEDICATIONS °· You may resume your normal medications unless your doctor tells you otherwise.  °WHAT YOU CAN EXPECT TODAY °· Some feelings of bloating in the abdomen.  °· Passage of more gas than usual.  °· Spotting of blood in your stool or on the toilet paper.  °IF YOU HAD POLYPS REMOVED DURING THE COLONOSCOPY: °· No aspirin products for 7 days or as instructed.  °· No alcohol for 7 days or as instructed.  °· Eat a soft diet for the next 24 hours.  °FINDING OUT THE RESULTS OF YOUR TEST °Not all test results are available during your visit. If your test results are not back during the visit, make an appointment with your caregiver to find out the results. Do not assume everything is normal if you have not heard from your caregiver or the medical facility. It is important for you to follow up on all of your test results.  °SEEK IMMEDIATE MEDICAL ATTENTION IF: °· You have more than a spotting of blood in your stool.  °· Your belly is swollen (abdominal distention).  °· You are nauseated or vomiting.  °· You have a temperature over 101.  °· You have abdominal pain or discomfort that is severe or gets worse throughout the day.  ° °Colon polyp and diverticulosis information provided ° °Further recommendations to follow pending review of pathology report ° °

## 2017-12-25 NOTE — Anesthesia Postprocedure Evaluation (Signed)
Anesthesia Post Note  Patient: Ryan [name]  Procedure(s) Performed: COLONOSCOPY WITH PROPOFOL (N/A ) POLYPECTOMY  Patient location during evaluation: PACU Anesthesia Type: General Level of consciousness: awake and alert, oriented and patient cooperative Pain management: pain level controlled Vital Signs Assessment: post-procedure vital signs reviewed and stable Respiratory status: spontaneous breathing Cardiovascular status: stable Postop Assessment: no apparent nausea or vomiting Anesthetic complications: no     Last Vitals:  Vitals:   12/25/17 1153 12/25/17 1354  BP: 130/82 117/64  Pulse: 64 74  Resp: 17 20  Temp: 36.9 C (P) 36.7 C  SpO2: 92% 95%    Last Pain:  Vitals:   12/25/17 1153  TempSrc: Oral  PainSc: 0-No pain                 ADAMS, AMY A

## 2017-12-25 NOTE — H&P (Signed)
@LOGO @   Primary Care Physician:  Asencion Noble, MD Primary Gastroenterologist:  Dr. Gala Romney  Pre-Procedure History & Physical: HPI:  Ryan [name] is a 62 y.o. male here for surveillance colonoscopy. History of multiple colonic adenomas removed in the past.  Past Medical History:  Diagnosis Date  . Anemia   . Arthritis   . Asthma   . COPD (chronic obstructive pulmonary disease) ([place])   . Depression   . GERD (gastroesophageal reflux disease)   . High cholesterol    diet controlled  . Hypertension    diet controlled  . Sleep apnea    CPAP    Past Surgical History:  Procedure Laterality Date  . CATARACT EXTRACTION  2018  . CATARACT EXTRACTION W/PHACO Right 02/23/2015   Procedure: CATARACT EXTRACTION PHACO AND INTRAOCULAR LENS PLACEMENT (IOC);  Surgeon: Williams Che, MD;  Location: [facility];  Service: Ophthalmology;  Laterality: Right;  CDE:5.02  . CHOLECYSTECTOMY    . COLONOSCOPY  03/15/2005   Dr. Margel Joens:Rectal polyps (diminutive), status post cold biopsy/removal/normal colon. hyperplastic  . COLONOSCOPY N/A 08/27/2014   Dr. Gala Romney: Multiple rectal and colonic polyps removed, one which was a 1 cm sessile polyp. Cecal AVMS ablated as described above. colonic Diverticulosis. Path with rectal hyperplastic polyp, ascending colon polyps with benign colonic mucosa, sessile serrated polyp with associated lipoma. Recommendation for surveillance colonoscopy in 3 years.   . ESOPHAGOGASTRODUODENOSCOPY N/A 08/27/2014   Dr. Lorri Fukuhara:Tiny distal esophageal erosions consistent with mild erosive reflux esophaigitis. Hiatal hernia. Antral erosions status post gastric biopsy. mild chronic gastritis, negative H.pylori  . GIVENS CAPSULE STUDY N/A 11/05/2014   Procedure: GIVENS CAPSULE STUDY;  Surgeon: Daneil Dolin, MD;  Location: [facility] SUITE;  Service: Endoscopy;  Laterality: N/A;  0800  . KNEE ARTHROSCOPY Right     Prior to Admission medications   Medication Sig Start Date End Date Taking?  Authorizing Provider  acetaminophen (TYLENOL) 500 MG tablet Take 500 mg by mouth 3 (three) times daily.    Yes [provider]  Coenzyme Q10 (COQ10) 100 MG CAPS Take 100 mg by mouth daily.    Yes [provider]  fluticasone-salmeterol (ADVAIR HFA) 115-21 MCG/ACT inhaler Inhale 1-2 puffs into the lungs 2 (two) times daily. 2 puffs in the morning and 1 puff in the evening. 09/14/10  Yes [provider]  gabapentin (NEURONTIN) 100 MG capsule Take 100 mg by mouth 3 (three) times daily.  03/09/10  Yes [provider]  lansoprazole (PREVACID SOLUTAB) 15 MG disintegrating tablet Take 15 mg by mouth daily.    Yes [provider]  levalbuterol (XOPENEX HFA) 45 MCG/ACT inhaler Inhale 1 puff into the lungs 2 (two) times daily as needed for wheezing or shortness of breath.    Yes [provider]  mirtazapine (REMERON) 15 MG tablet Take 15 mg by mouth at bedtime.    Yes [provider]  PRAVASTATIN SODIUM PO Take 20 mg by mouth daily.    Yes [provider]  traMADol (ULTRAM) 50 MG tablet Take 50 mg by mouth 3 (three) times daily.  03/12/10  Yes [provider]  zolpidem (AMBIEN) 10 MG tablet Take 10 mg by mouth at bedtime as needed for sleep.  03/09/10  Yes [provider]    Allergies as of 10/24/2017 - Review Complete 10/24/2017  Allergen Reaction Noted  . Latex  11/30/2010  . Tiotropium bromide monohydrate Other (See Comments) 08/18/2014    Family History  Problem Relation Age of Onset  .  Cancer Mother        unsure primary  . Colon cancer Neg Hx     Social History   Socioeconomic History  . Marital status: Married    Spouse name: Not on file  . Number of children: Not on file  . Years of education: Not on file  . Highest education level: Not on file  Occupational History  . Occupation: [employer]  . Financial resource strain: Not on file  . Food insecurity:    Worry: Not on  file    Inability: Not on file  . Transportation needs:    Medical: Not on file    Non-medical: Not on file  Tobacco Use  . Smoking status: Former Smoker    Packs/day: 2.00    Years: 35.00    Pack years: 70.00    Types: Cigarettes    Last attempt to quit: 10/16/2010    Years since quitting: 7.1  . Smokeless tobacco: Never Used  Substance and Sexual Activity  . Alcohol use: Yes    Alcohol/week: 1.2 oz    Types: 2 Shots of liquor per week    Comment: couple of drinks of borboun every night  . Drug use: No  . Sexual activity: Yes    Birth control/protection: None  Lifestyle  . Physical activity:    Days per week: Not on file    Minutes per session: Not on file  . Stress: Not on file  Relationships  . Social connections:    Talks on phone: Not on file    Gets together: Not on file    Attends religious service: Not on file    Active member of club or organization: Not on file    Attends meetings of clubs or organizations: Not on file    Relationship status: Not on file  . Intimate partner violence:    Fear of current or ex partner: Not on file    Emotionally abused: Not on file    Physically abused: Not on file    Forced sexual activity: Not on file  Other Topics Concern  . Not on file  Social History Narrative  . Not on file    Review of Systems: See HPI, otherwise negative ROS  Physical Exam: BP 130/82   Pulse 64 Comment: NSR  Temp 98.4 F (36.9 C) (Oral)   Resp 17   Ht 5\' 10"  (1.778 m)   Wt 233 lb (105.7 kg)   SpO2 92%   BMI 33.43 kg/m  General:   Alert,  , pleasant and cooperative in NAD Neck:  Supple; no masses or thyromegaly. No significant cervical adenopathy. Lungs:  Clear throughout to auscultation.   No wheezes, crackles, or rhonchi. No acute distress. Heart:  Regular rate and rhythm; no murmurs, clicks, rubs,  or gallops. Abdomen: Non-distended, normal bowel sounds.  Soft and nontender without appreciable mass or hepatosplenomegaly.  Pulses:   Normal pulses noted. Extremities:  Without clubbing or edema.  Impression:  Pleasant 62 year old gentleman history of colonic adenomas. Here for surveillance colonoscopy.   Recommendations: I have offered the patient is surveillance colonoscopy today.  The risks, benefits, limitations, alternatives and imponderables have been reviewed with the patient. Questions have been answered. All parties are agreeable.   Notice: This dictation was prepared with Dragon dictation along with smaller phrase technology. Any transcriptional errors that result from this process are unintentional and may not be corrected upon review.

## 2017-12-25 NOTE — Anesthesia Procedure Notes (Signed)
Procedure Name: MAC Performed by: Adams, Amy A, CRNA Pre-anesthesia Checklist: Patient identified, Emergency Drugs available, Suction available, Patient being monitored and Timeout performed Oxygen Delivery Method: Simple face mask       

## 2017-12-25 NOTE — Transfer of Care (Signed)
Immediate Anesthesia Transfer of Care Note  Patient: Ryan [name]  Procedure(s) Performed: COLONOSCOPY WITH PROPOFOL (N/A ) POLYPECTOMY  Patient Location: PACU  Anesthesia Type:MAC  Level of Consciousness: awake, alert , oriented and patient cooperative  Airway & Oxygen Therapy: Patient Spontanous Breathing  Post-op Assessment: Report given to RN and Post -op Vital signs reviewed and stable  Post vital signs: Reviewed and stable  Last Vitals:  Vitals Value Taken Time  BP 117/64 12/25/2017  1:53 PM  Temp    Pulse 76 12/25/2017  1:55 PM  Resp 21 12/25/2017  1:55 PM  SpO2 95 % 12/25/2017  1:55 PM  Vitals shown include unvalidated device data.  Last Pain:  Vitals:   12/25/17 1153  TempSrc: Oral  PainSc: 0-No pain      Patients Stated Pain Goal: 8 (49/17/91 [id number])  Complications: No apparent anesthesia complications

## 2017-12-25 NOTE — Anesthesia Preprocedure Evaluation (Signed)
Anesthesia Evaluation  Patient identified by MRN, date of birth, ID band Patient awake    Reviewed: Allergy & Precautions, NPO status , Patient's Chart, lab work & pertinent test results  Airway Mallampati: II  TM Distance: >3 FB Neck ROM: Full    Dental no notable dental hx.    Pulmonary neg pulmonary ROS, asthma , sleep apnea and Continuous Positive Airway Pressure Ventilation , COPD,  COPD inhaler, former smoker,    Pulmonary exam normal breath sounds clear to auscultation       Cardiovascular Exercise Tolerance: Good hypertension, Pt. on medications negative cardio ROS Normal cardiovascular examI Rhythm:Regular Rate:Normal     Neuro/Psych PSYCHIATRIC DISORDERS Depression negative neurological ROS  negative psych ROS   GI/Hepatic negative GI ROS, Neg liver ROS, GERD  Medicated and Controlled,  Endo/Other  negative endocrine ROS  Renal/GU negative Renal ROS  negative genitourinary   Musculoskeletal negative musculoskeletal ROS (+) Arthritis , Osteoarthritis,    Abdominal   Peds negative pediatric ROS (+)  Hematology negative hematology ROS (+) anemia ,   Anesthesia Other Findings   Reproductive/Obstetrics negative OB ROS                             Anesthesia Physical Anesthesia Plan  ASA: II  Anesthesia Plan: General   Post-op Pain Management:    Induction: Intravenous  PONV Risk Score and Plan:   Airway Management Planned: Simple Face Mask  Additional Equipment:   Intra-op Plan:   Post-operative Plan:   Informed Consent: I have reviewed the patients History and Physical, chart, labs and discussed the procedure including the risks, benefits and alternatives for the proposed anesthesia with the patient or authorized representative who has indicated his/her understanding and acceptance.     Plan Discussed with: CRNA  Anesthesia Plan Comments:         Anesthesia  Quick Evaluation

## 2017-12-28 ENCOUNTER — Encounter: Payer: Self-pay | Admitting: Gastroenterology

## 2017-12-28 ENCOUNTER — Encounter: Payer: Self-pay | Admitting: Internal Medicine

## 2017-12-29 ENCOUNTER — Encounter (HOSPITAL_COMMUNITY): Payer: Self-pay | Admitting: Internal Medicine

## 2018-01-12 DIAGNOSIS — G4733 Obstructive sleep apnea (adult) (pediatric): Secondary | ICD-10-CM | POA: Diagnosis not present

## 2018-02-12 DIAGNOSIS — G4733 Obstructive sleep apnea (adult) (pediatric): Secondary | ICD-10-CM | POA: Diagnosis not present

## 2018-03-09 ENCOUNTER — Emergency Department (HOSPITAL_COMMUNITY)
Admission: EM | Admit: 2018-03-09 | Discharge: 2018-03-09 | Disposition: A | Discharge status: Home / Self Care | Payer: BLUE CROSS/BLUE SHIELD | Attending: Emergency Medicine | Admitting: Emergency Medicine

## 2018-03-09 ENCOUNTER — Other Ambulatory Visit: Payer: Self-pay

## 2018-03-09 ENCOUNTER — Encounter (HOSPITAL_COMMUNITY): Payer: Self-pay | Admitting: Emergency Medicine

## 2018-03-09 DIAGNOSIS — R21 Rash and other nonspecific skin eruption: Secondary | ICD-10-CM | POA: Insufficient documentation

## 2018-03-09 DIAGNOSIS — Y929 Unspecified place or not applicable: Secondary | ICD-10-CM | POA: Diagnosis not present

## 2018-03-09 DIAGNOSIS — S30861A Insect bite (nonvenomous) of abdominal wall, initial encounter: Secondary | ICD-10-CM | POA: Diagnosis not present

## 2018-03-09 DIAGNOSIS — Z91040 Latex allergy status: Secondary | ICD-10-CM | POA: Insufficient documentation

## 2018-03-09 DIAGNOSIS — I10 Essential (primary) hypertension: Secondary | ICD-10-CM | POA: Diagnosis not present

## 2018-03-09 DIAGNOSIS — X58XXXA Exposure to other specified factors, initial encounter: Secondary | ICD-10-CM | POA: Diagnosis not present

## 2018-03-09 DIAGNOSIS — Y999 Unspecified external cause status: Secondary | ICD-10-CM | POA: Diagnosis not present

## 2018-03-09 DIAGNOSIS — Z87891 Personal history of nicotine dependence: Secondary | ICD-10-CM | POA: Diagnosis not present

## 2018-03-09 DIAGNOSIS — Z79899 Other long term (current) drug therapy: Secondary | ICD-10-CM | POA: Diagnosis not present

## 2018-03-09 DIAGNOSIS — J449 Chronic obstructive pulmonary disease, unspecified: Secondary | ICD-10-CM | POA: Insufficient documentation

## 2018-03-09 DIAGNOSIS — Y939 Activity, unspecified: Secondary | ICD-10-CM | POA: Insufficient documentation

## 2018-03-09 DIAGNOSIS — W57XXXA Bitten or stung by nonvenomous insect and other nonvenomous arthropods, initial encounter: Secondary | ICD-10-CM

## 2018-03-09 MED ORDER — DOXYCYCLINE HYCLATE 100 MG PO CAPS: 100.0000 mg | ORAL_CAPSULE | Freq: Two times a day (BID) | ORAL | 0 refills | Status: DC

## 2018-03-09 NOTE — Discharge Instructions (Addendum)
You are being treated with doxycycline, the antibiotic that will cover you for any tick borne infections.  I also recommend taking benadryl for your itching, (claritin or zyrtec would be the non sedating alternatives).  It is also possible your rash is from your increased use of the cbd oil.  I advise stopping this medicine for the short term as discussed until your symptoms are better.

## 2018-03-09 NOTE — ED Triage Notes (Signed)
Pt reports rash from waist to upper extremities. Pt reports was bitten by a tick x2 weeks ago and reports started CBD oil x1 week. Pt alert and oriented. Airway patent. Pt denies any pain.

## 2018-03-10 NOTE — ED Provider Notes (Signed)
[facility] EMERGENCY DEPARTMENT Provider Note   CSN: [id number] Arrival date & time: 03/09/18  1540     History   Chief Complaint Chief Complaint  Patient presents with  . Rash    HPI Ryan [name] is a 62 y.o. male with a history as outlined below, significant for asthma and COPD, hypertension presenting with a rash which started yesterday.  It is mildly pruritic, started at his waistline and involves his abdomen, chest and scattered on his upper extremities.  He reports being bit by a tick 2 weeks ago at his waistline, the area became reddened and slightly hardened for several days afterwards but has since resolved.  He denies fevers or chills, abdominal pain, nausea, vomiting, joint pain or swelling.  He also denies shortness of breath, wheezing, cough, mouth throat or tongue swelling or itching.  He ate a meal of beef stew yesterday evening and has noted several episodes of diarrhea afterwards which has improved today.  However he started thinking about possible tick reaction and potential for meat allergy given the symptom.  He also however reports starting CBD oil supplementation in gel tab form 1 week ago to help with chronic shoulder pain.  He states he has had this supplementation in the past, but only sporadically is only started taking it daily 1 week ago.  He recognizes no other potential exposures, soaps, lotions, medications which might be rash triggers.  He has had no treatment for his symptoms prior to arrival.  The history is provided by the patient.    Past Medical History:  Diagnosis Date  . Anemia   . Arthritis   . Asthma   . COPD (chronic obstructive pulmonary disease) ([place])   . Depression   . GERD (gastroesophageal reflux disease)   . High cholesterol    diet controlled  . Hypertension    diet controlled  . Sleep apnea    CPAP    Patient Active Problem List   Diagnosis Date Noted  . Mucosal abnormality of stomach   . AVM (arteriovenous malformation)     . History of colonic polyps   . Diverticulosis of colon without hemorrhage   . IDA (iron deficiency anemia) 08/08/2014  . HYPERTENSION 01/04/2009  . ALLERGIC RHINITIS 01/04/2009  . OBSTRUCTIVE SLEEP APNEA 01/01/2009    Past Surgical History:  Procedure Laterality Date  . CATARACT EXTRACTION  2018  . CATARACT EXTRACTION W/PHACO Right 02/23/2015   Procedure: CATARACT EXTRACTION PHACO AND INTRAOCULAR LENS PLACEMENT (IOC);  Surgeon: Williams Che, MD;  Location: [facility];  Service: Ophthalmology;  Laterality: Right;  CDE:5.02  . CHOLECYSTECTOMY    . COLONOSCOPY  03/15/2005   Dr. Rourk:Rectal polyps (diminutive), status post cold biopsy/removal/normal colon. hyperplastic  . COLONOSCOPY N/A 08/27/2014   Dr. Gala Romney: Multiple rectal and colonic polyps removed, one which was a 1 cm sessile polyp. Cecal AVMS ablated as described above. colonic Diverticulosis. Path with rectal hyperplastic polyp, ascending colon polyps with benign colonic mucosa, sessile serrated polyp with associated lipoma. Recommendation for surveillance colonoscopy in 3 years.   . COLONOSCOPY WITH PROPOFOL N/A 12/25/2017   Procedure: COLONOSCOPY WITH PROPOFOL;  Surgeon: Daneil Dolin, MD;  Location: [facility];  Service: Endoscopy;  Laterality: N/A;  1:45pm  . ESOPHAGOGASTRODUODENOSCOPY N/A 08/27/2014   Dr. Rourk:Tiny distal esophageal erosions consistent with mild erosive reflux esophaigitis. Hiatal hernia. Antral erosions status post gastric biopsy. mild chronic gastritis, negative H.pylori  . GIVENS CAPSULE STUDY N/A 11/05/2014   Procedure: GIVENS CAPSULE  STUDY;  Surgeon: Daneil Dolin, MD;  Location: [facility];  Service: Endoscopy;  Laterality: N/A;  0800  . KNEE ARTHROSCOPY Right   . POLYPECTOMY  12/25/2017   Procedure: POLYPECTOMY;  Surgeon: Daneil Dolin, MD;  Location: [facility];  Service: Endoscopy;;        Home Medications    Prior to Admission medications   Medication Sig Start Date End Date Taking?  Authorizing Provider  acetaminophen (TYLENOL) 500 MG tablet Take 500 mg by mouth 3 (three) times daily.     [provider]  Coenzyme Q10 (COQ10) 100 MG CAPS Take 100 mg by mouth daily.     [provider]  doxycycline (VIBRAMYCIN) 100 MG capsule Take 1 capsule (100 mg total) by mouth 2 (two) times daily. 03/09/18   Evalee Jefferson, PA-C  fluticasone-salmeterol (ADVAIR HFA) 301-60 MCG/ACT inhaler Inhale 1-2 puffs into the lungs 2 (two) times daily. 2 puffs in the morning and 1 puff in the evening. 09/14/10   [provider]  gabapentin (NEURONTIN) 100 MG capsule Take 100 mg by mouth 3 (three) times daily.  03/09/10   [provider]  lansoprazole (PREVACID SOLUTAB) 15 MG disintegrating tablet Take 15 mg by mouth daily.     [provider]  levalbuterol (XOPENEX HFA) 45 MCG/ACT inhaler Inhale 1 puff into the lungs 2 (two) times daily as needed for wheezing or shortness of breath.     [provider]  mirtazapine (REMERON) 15 MG tablet Take 15 mg by mouth at bedtime.     [provider]  PRAVASTATIN SODIUM PO Take 20 mg by mouth daily.     [provider]  traMADol (ULTRAM) 50 MG tablet Take 50 mg by mouth 3 (three) times daily.  03/12/10   [provider]  zolpidem (AMBIEN) 10 MG tablet Take 10 mg by mouth at bedtime as needed for sleep.  03/09/10   [provider]    Family History Family History  Problem Relation Age of Onset  . Cancer Mother        unsure primary  . Colon cancer Neg Hx     Social History Social History   Tobacco Use  . Smoking status: Former Smoker    Packs/day: 2.00    Years: 35.00    Pack years: 70.00    Types: Cigarettes    Last attempt to quit: 10/15/2008    Years since quitting: 9.4  . Smokeless tobacco: Never Used  Substance Use Topics  . Alcohol use: Yes    Alcohol/week: 2.0 standard drinks    Types: 2 Shots of liquor per week    Comment: couple of drinks of borboun every night   . Drug use: No     Allergies   Latex and Tiotropium bromide monohydrate   Review of Systems Review of Systems  Constitutional: Negative for chills and fever.  HENT: Negative.   Respiratory: Negative for cough, shortness of breath and wheezing.   Gastrointestinal: Negative for abdominal pain.  Musculoskeletal: Negative for arthralgias and joint swelling.  Skin: Positive for rash.  Neurological: Negative for numbness.     Physical Exam Updated Vital Signs BP 137/89 (BP Location: Right Arm)   Pulse (!) 107   Temp 99 F (37.2 C) (Oral)   Resp 18   Ht 5\' 10"  (1.778 m)   Wt 104.3 kg   SpO2 96%   BMI 33.00 kg/m   Physical Exam  Constitutional: He appears well-developed and well-nourished.  No distress.  HENT:  Head: Normocephalic.  Neck: Neck supple.  Cardiovascular: Normal rate.  Pulmonary/Chest: Effort normal. He has no wheezes.  Musculoskeletal: Normal range of motion. He exhibits no edema.  Skin: Rash noted.  Slightly raised, erythematous somewhat lacy appearing rash on lower abdomen with more distinct lesions on bilateral forearms. Small papule at site of tick bite, no bullseye lesion or surrounding erythema.      ED Treatments / Results  Labs (all labs ordered are listed, but only abnormal results are displayed) Labs Reviewed  ROCKY MTN SPOTTED FVR ABS PNL(IGG+IGM)    EKG None  Radiology No results found.  Procedures Procedures (including critical care time)  Medications Ordered in ED Medications - No data to display   Initial Impression / Assessment and Plan / ED Course  I have reviewed the triage vital signs and the nursing notes.  Pertinent labs & imaging results that were available during my care of the patient were reviewed by me and considered in my medical decision making (see chart for details).     Tick titers drawn.  Pt was placed on doxycycline given his positive exposure. It is unclear the source of his rash, could also be result of  the cbd oil usage, advised to stop taking until the rash clears, in the event this is a allergic reaction.  No fevers, no joint pain, no headache, neck pain or stiffness.  Minimal diarrhea, none here, advised imodium if this sx persist. Plan f/u with pcp for any persistent or worsening sx.  Pt aware lab tests pending.  Final Clinical Impressions(s) / ED Diagnoses   Final diagnoses:  Rash  Tick bite of abdomen, initial encounter    ED Discharge Orders         Ordered    doxycycline (VIBRAMYCIN) 100 MG capsule  2 times daily     03/09/18 1040           Evalee Jefferson, PA-C 03/10/18 1701    Milton Ferguson, MD 03/11/18 [phone]

## 2018-03-14 DIAGNOSIS — G4733 Obstructive sleep apnea (adult) (pediatric): Secondary | ICD-10-CM | POA: Diagnosis not present

## 2018-03-14 LAB — ROCKY MTN SPOTTED FVR ABS PNL(IGG+IGM)
RMSF IgG: NEGATIVE
RMSF IgM: 1.37 index — ABNORMAL HIGH (ref 0.00–0.89)

## 2018-03-29 DIAGNOSIS — A770 Spotted fever due to Rickettsia rickettsii: Secondary | ICD-10-CM | POA: Diagnosis not present

## 2018-03-29 DIAGNOSIS — J449 Chronic obstructive pulmonary disease, unspecified: Secondary | ICD-10-CM | POA: Diagnosis not present

## 2018-04-13 DIAGNOSIS — G4733 Obstructive sleep apnea (adult) (pediatric): Secondary | ICD-10-CM | POA: Diagnosis not present

## 2018-05-15 DIAGNOSIS — M47816 Spondylosis without myelopathy or radiculopathy, lumbar region: Secondary | ICD-10-CM | POA: Diagnosis not present

## 2018-05-15 DIAGNOSIS — M546 Pain in thoracic spine: Secondary | ICD-10-CM | POA: Diagnosis not present

## 2018-05-15 DIAGNOSIS — M47812 Spondylosis without myelopathy or radiculopathy, cervical region: Secondary | ICD-10-CM | POA: Diagnosis not present

## 2018-05-17 DIAGNOSIS — M47812 Spondylosis without myelopathy or radiculopathy, cervical region: Secondary | ICD-10-CM | POA: Diagnosis not present

## 2018-05-17 DIAGNOSIS — M546 Pain in thoracic spine: Secondary | ICD-10-CM | POA: Diagnosis not present

## 2018-05-17 DIAGNOSIS — M47816 Spondylosis without myelopathy or radiculopathy, lumbar region: Secondary | ICD-10-CM | POA: Diagnosis not present

## 2018-05-21 DIAGNOSIS — M546 Pain in thoracic spine: Secondary | ICD-10-CM | POA: Diagnosis not present

## 2018-05-21 DIAGNOSIS — M47812 Spondylosis without myelopathy or radiculopathy, cervical region: Secondary | ICD-10-CM | POA: Diagnosis not present

## 2018-05-21 DIAGNOSIS — M47816 Spondylosis without myelopathy or radiculopathy, lumbar region: Secondary | ICD-10-CM | POA: Diagnosis not present

## 2018-05-24 DIAGNOSIS — M47816 Spondylosis without myelopathy or radiculopathy, lumbar region: Secondary | ICD-10-CM | POA: Diagnosis not present

## 2018-05-24 DIAGNOSIS — M47812 Spondylosis without myelopathy or radiculopathy, cervical region: Secondary | ICD-10-CM | POA: Diagnosis not present

## 2018-05-24 DIAGNOSIS — M546 Pain in thoracic spine: Secondary | ICD-10-CM | POA: Diagnosis not present

## 2018-05-28 DIAGNOSIS — M546 Pain in thoracic spine: Secondary | ICD-10-CM | POA: Diagnosis not present

## 2018-05-28 DIAGNOSIS — M47812 Spondylosis without myelopathy or radiculopathy, cervical region: Secondary | ICD-10-CM | POA: Diagnosis not present

## 2018-05-28 DIAGNOSIS — M47816 Spondylosis without myelopathy or radiculopathy, lumbar region: Secondary | ICD-10-CM | POA: Diagnosis not present

## 2018-06-04 DIAGNOSIS — M47812 Spondylosis without myelopathy or radiculopathy, cervical region: Secondary | ICD-10-CM | POA: Diagnosis not present

## 2018-06-04 DIAGNOSIS — M47816 Spondylosis without myelopathy or radiculopathy, lumbar region: Secondary | ICD-10-CM | POA: Diagnosis not present

## 2018-06-04 DIAGNOSIS — M546 Pain in thoracic spine: Secondary | ICD-10-CM | POA: Diagnosis not present

## 2018-06-11 DIAGNOSIS — M47812 Spondylosis without myelopathy or radiculopathy, cervical region: Secondary | ICD-10-CM | POA: Diagnosis not present

## 2018-06-11 DIAGNOSIS — M546 Pain in thoracic spine: Secondary | ICD-10-CM | POA: Diagnosis not present

## 2018-06-11 DIAGNOSIS — M47816 Spondylosis without myelopathy or radiculopathy, lumbar region: Secondary | ICD-10-CM | POA: Diagnosis not present

## 2018-06-18 DIAGNOSIS — M47812 Spondylosis without myelopathy or radiculopathy, cervical region: Secondary | ICD-10-CM | POA: Diagnosis not present

## 2018-06-18 DIAGNOSIS — M47816 Spondylosis without myelopathy or radiculopathy, lumbar region: Secondary | ICD-10-CM | POA: Diagnosis not present

## 2018-06-18 DIAGNOSIS — M546 Pain in thoracic spine: Secondary | ICD-10-CM | POA: Diagnosis not present

## 2018-06-27 DIAGNOSIS — M546 Pain in thoracic spine: Secondary | ICD-10-CM | POA: Diagnosis not present

## 2018-06-27 DIAGNOSIS — M47816 Spondylosis without myelopathy or radiculopathy, lumbar region: Secondary | ICD-10-CM | POA: Diagnosis not present

## 2018-06-27 DIAGNOSIS — M47812 Spondylosis without myelopathy or radiculopathy, cervical region: Secondary | ICD-10-CM | POA: Diagnosis not present

## 2018-07-13 DIAGNOSIS — G4733 Obstructive sleep apnea (adult) (pediatric): Secondary | ICD-10-CM | POA: Diagnosis not present

## 2018-07-16 DIAGNOSIS — M546 Pain in thoracic spine: Secondary | ICD-10-CM | POA: Diagnosis not present

## 2018-07-16 DIAGNOSIS — M47812 Spondylosis without myelopathy or radiculopathy, cervical region: Secondary | ICD-10-CM | POA: Diagnosis not present

## 2018-07-16 DIAGNOSIS — M47816 Spondylosis without myelopathy or radiculopathy, lumbar region: Secondary | ICD-10-CM | POA: Diagnosis not present

## 2018-08-07 DIAGNOSIS — Z6834 Body mass index (BMI) 34.0-34.9, adult: Secondary | ICD-10-CM | POA: Diagnosis not present

## 2018-08-07 DIAGNOSIS — Z9989 Dependence on other enabling machines and devices: Secondary | ICD-10-CM | POA: Diagnosis not present

## 2018-08-07 DIAGNOSIS — R0609 Other forms of dyspnea: Secondary | ICD-10-CM | POA: Diagnosis not present

## 2018-08-07 DIAGNOSIS — G4733 Obstructive sleep apnea (adult) (pediatric): Secondary | ICD-10-CM | POA: Diagnosis not present

## 2018-08-07 DIAGNOSIS — E669 Obesity, unspecified: Secondary | ICD-10-CM | POA: Diagnosis not present

## 2018-09-20 DIAGNOSIS — J449 Chronic obstructive pulmonary disease, unspecified: Secondary | ICD-10-CM | POA: Diagnosis not present

## 2018-09-20 DIAGNOSIS — E785 Hyperlipidemia, unspecified: Secondary | ICD-10-CM | POA: Diagnosis not present

## 2018-09-20 DIAGNOSIS — Z125 Encounter for screening for malignant neoplasm of prostate: Secondary | ICD-10-CM | POA: Diagnosis not present

## 2018-09-20 DIAGNOSIS — I10 Essential (primary) hypertension: Secondary | ICD-10-CM | POA: Diagnosis not present

## 2018-09-20 DIAGNOSIS — Z79899 Other long term (current) drug therapy: Secondary | ICD-10-CM | POA: Diagnosis not present

## 2018-09-27 DIAGNOSIS — E785 Hyperlipidemia, unspecified: Secondary | ICD-10-CM | POA: Diagnosis not present

## 2018-09-27 DIAGNOSIS — Z6834 Body mass index (BMI) 34.0-34.9, adult: Secondary | ICD-10-CM | POA: Diagnosis not present

## 2018-09-27 DIAGNOSIS — I10 Essential (primary) hypertension: Secondary | ICD-10-CM | POA: Diagnosis not present

## 2018-12-26 DIAGNOSIS — M19012 Primary osteoarthritis, left shoulder: Secondary | ICD-10-CM | POA: Diagnosis not present

## 2018-12-26 DIAGNOSIS — M25512 Pain in left shoulder: Secondary | ICD-10-CM | POA: Diagnosis not present

## 2019-01-07 DIAGNOSIS — Z961 Presence of intraocular lens: Secondary | ICD-10-CM | POA: Diagnosis not present

## 2019-01-07 DIAGNOSIS — H43391 Other vitreous opacities, right eye: Secondary | ICD-10-CM | POA: Diagnosis not present

## 2019-01-07 DIAGNOSIS — H26493 Other secondary cataract, bilateral: Secondary | ICD-10-CM | POA: Diagnosis not present

## 2019-01-08 DIAGNOSIS — M19012 Primary osteoarthritis, left shoulder: Secondary | ICD-10-CM | POA: Diagnosis not present

## 2019-01-08 DIAGNOSIS — I10 Essential (primary) hypertension: Secondary | ICD-10-CM | POA: Diagnosis not present

## 2019-01-25 ENCOUNTER — Other Ambulatory Visit: Payer: Self-pay | Admitting: Orthopedic Surgery

## 2019-01-25 DIAGNOSIS — M19012 Primary osteoarthritis, left shoulder: Secondary | ICD-10-CM

## 2019-02-21 ENCOUNTER — Ambulatory Visit
Admission: RE | Admit: 2019-02-21 | Discharge: 2019-02-21 | Disposition: A | Discharge status: Home / Self Care | Payer: BC Managed Care – PPO | Source: Ambulatory Visit | Attending: Orthopedic Surgery | Admitting: Orthopedic Surgery

## 2019-02-21 DIAGNOSIS — M19012 Primary osteoarthritis, left shoulder: Secondary | ICD-10-CM

## 2019-02-22 NOTE — Patient Instructions (Addendum)
YOU  HAD A COVID 19 TEST ON 02-25-2019. ONCE YOUR COVID TEST IS COMPLETED, PLEASE BEGIN THE QUARANTINE INSTRUCTIONS AS OUTLINED IN YOUR HANDOUT.                Ryan [name]      Your procedure is scheduled on: 02-28-2019    Report to [facility] Main  Entrance    Report to admitting at 730 AM    [address] [place]. NO VISITORS ARE ALLOWED IN SHORT STAY OR RECOVERY ROOM.   BRING CPAP MASK AND TUBING   Call this number if you have problems the morning of surgery [phone]    Remember:  [place] AND RINSE YOUR MOUTH OUT, NO CHEWING GUM CANDY OR MINTS.   NO SOLID FOOD AFTER MIDNIGHT THE NIGHT PRIOR TO SURGERY.    NOTHING BY MOUTH EXCEPT CLEAR LIQUIDS UNTIL 700 AM  . PLEASE FINISH ENSURE DRINK PER SURGEON ORDER  WHICH NEEDS TO BE COMPLETED AT   700 AM.   CLEAR LIQUID DIET   Foods Allowed                                                                     Foods Excluded  Coffee and tea, regular and decaf                             liquids that you cannot  Plain Jell-O any favor except red or purple                                           see through such as: Fruit ices (not with fruit pulp)                                     milk, soups, orange juice  Iced Popsicles                                    All solid food Carbonated beverages, regular and diet                                    Cranberry, grape and apple juices Sports drinks like Gatorade Lightly seasoned clear broth or consume(fat free) Sugar, honey syrup  Sample Menu Breakfast                                Lunch                                     Supper Cranberry juice  Beef broth                            Chicken broth Jell-O                                     Grape juice                           Apple juice Coffee or tea                        Jell-O                                       Popsicle                                                Coffee or tea                        Coffee or tea  _____________________________________________________________________     Take these medicines the morning of surgery with A SIP OF WATER: GABAPENTIN, PREVACID,   USE YOUR ADVAIR PER NORMAL ROUTINE                               You may not have any metal on your body including hair pins and              piercings  Do not wear jewelry, make-up, lotions, powders or perfumes, deodorant                      Men may shave face and neck.   Do not bring valuables to the hospital. [place].  Contacts, dentures or bridgework may not be worn into surgery.  Leave suitcase in the car. After surgery it may be brought to your room.      _____________________________________________________________________  [facility] - Preparing for Surgery Before surgery, you can play an important role.  Because skin is not sterile, your skin needs to be as free of germs as possible.  You can reduce the number of germs on your skin by washing with CHG (chlorahexidine gluconate) soap before surgery.  CHG is an antiseptic cleaner which kills germs and bonds with the skin to continue killing germs even after washing. Please DO NOT use if you have an allergy to CHG or antibacterial soaps.  If your skin becomes reddened/irritated stop using the CHG and inform your nurse when you arrive at Short Stay. Do not shave (including legs and underarms) for at least 48 hours prior to the first CHG shower.  You may shave your face/neck. Please follow these instructions carefully:  1.  Shower with CHG Soap the night before surgery and the  morning of Surgery.  2.  If you choose to wash your hair, wash your hair first as usual with your  normal  shampoo.  3.  After you shampoo, rinse your hair and body thoroughly to remove the  shampoo.                             4.  Use CHG  as you would any other liquid soap.  You can apply chg directly  to the skin and wash                       Gently with a scrungie or clean washcloth.  5.  Apply the CHG Soap to your body ONLY FROM THE NECK DOWN.   Do not use on face/ open                           Wound or open sores. Avoid contact with eyes, ears mouth and genitals (private parts).                       Wash face,  Genitals (private parts) with your normal soap.             6.  Wash thoroughly, paying special attention to the area where your surgery  will be performed.  7.  Thoroughly rinse your body with warm water from the neck down.  8.  DO NOT shower/wash with your normal soap after using and rinsing off  the CHG Soap.                9.  Pat yourself dry with a clean towel.            10.  Wear clean pajamas.            11.  Place clean sheets on your bed the night of your first shower and do not  sleep with pets. Day of Surgery : Do not apply any lotions/deodorants the morning of surgery.  Please wear clean clothes to the hospital/surgery center.  FAILURE TO FOLLOW THESE INSTRUCTIONS MAY RESULT IN THE CANCELLATION OF YOUR SURGERY PATIENT SIGNATURE_________________________________  NURSE SIGNATURE__________________________________  ________________________________________________________________________   Ryan [name]  An incentive spirometer is a tool that can help keep your lungs clear and active. This tool measures how well you are filling your lungs with each breath. Taking long deep breaths may help reverse or decrease the chance of developing breathing (pulmonary) problems (especially infection) following:  A long period of time when you are unable to move or be active. BEFORE THE PROCEDURE   If the spirometer includes an indicator to show your best effort, your nurse or respiratory therapist will set it to a desired goal.  If possible, sit up straight or lean slightly forward. Try not to  slouch.  Hold the incentive spirometer in an upright position. INSTRUCTIONS FOR USE  1. Sit on the edge of your bed if possible, or sit up as far as you can in bed or on a chair. 2. Hold the incentive spirometer in an upright position. 3. Breathe out normally. 4. Place the mouthpiece in your mouth and seal your lips tightly around it. 5. Breathe in slowly and as deeply as possible, raising the piston or the ball toward the top of the column. 6. Hold your breath for 3-5 seconds or for as long as possible. Allow the piston or ball to fall to the bottom of the column. 7. Remove the mouthpiece from your mouth and breathe out  normally. 8. Rest for a few seconds and repeat Steps 1 through 7 at least 10 times every 1-2 hours when you are awake. Take your time and take a few normal breaths between deep breaths. 9. The spirometer may include an indicator to show your best effort. Use the indicator as a goal to work toward during each repetition. 10. After each set of 10 deep breaths, practice coughing to be sure your lungs are clear. If you have an incision (the cut made at the time of surgery), support your incision when coughing by placing a pillow or rolled up towels firmly against it. Once you are able to get out of bed, walk around indoors and cough well. You may stop using the incentive spirometer when instructed by your caregiver.  RISKS AND COMPLICATIONS  Take your time so you do not get dizzy or light-headed.  If you are in pain, you may need to take or ask for pain medication before doing incentive spirometry. It is harder to take a deep breath if you are having pain. AFTER USE  Rest and breathe slowly and easily.  It can be helpful to keep track of a log of your progress. Your caregiver can provide you with a simple table to help with this. If you are using the spirometer at home, follow these instructions: [place] IF:   You are having difficultly using the spirometer.  You  have trouble using the spirometer as often as instructed.  Your pain medication is not giving enough relief while using the spirometer.  You develop fever of 100.5 F (38.1 C) or higher. SEEK IMMEDIATE MEDICAL CARE IF:   You cough up bloody sputum that had not been present before.  You develop fever of 102 F (38.9 C) or greater.  You develop worsening pain at or near the incision site. MAKE SURE YOU:   Understand these instructions.  Will watch your condition.  Will get help right away if you are not doing well or get worse. Document Released: 11/07/2006 Document Revised: 09/19/2011 Document Reviewed: 01/08/2007 [facility] Patient Information 2014 [place], [place].   ________________________________________________________________________

## 2019-02-25 ENCOUNTER — Other Ambulatory Visit: Payer: Self-pay

## 2019-02-25 ENCOUNTER — Encounter (HOSPITAL_COMMUNITY): Payer: Self-pay

## 2019-02-25 ENCOUNTER — Other Ambulatory Visit (HOSPITAL_COMMUNITY)
Admission: RE | Admit: 2019-02-25 | Discharge: 2019-02-25 | Disposition: A | Discharge status: Home / Self Care | Payer: BC Managed Care – PPO | Source: Ambulatory Visit | Attending: Orthopedic Surgery | Admitting: Orthopedic Surgery

## 2019-02-25 ENCOUNTER — Encounter (HOSPITAL_COMMUNITY)
Admission: RE | Admit: 2019-02-25 | Discharge: 2019-02-25 | Disposition: A | Discharge status: Home / Self Care | Payer: BC Managed Care – PPO | Source: Ambulatory Visit | Attending: Orthopedic Surgery | Admitting: Orthopedic Surgery

## 2019-02-25 DIAGNOSIS — Z20828 Contact with and (suspected) exposure to other viral communicable diseases: Secondary | ICD-10-CM | POA: Diagnosis not present

## 2019-02-25 DIAGNOSIS — M19012 Primary osteoarthritis, left shoulder: Secondary | ICD-10-CM | POA: Diagnosis not present

## 2019-02-25 DIAGNOSIS — Z01818 Encounter for other preprocedural examination: Secondary | ICD-10-CM | POA: Diagnosis not present

## 2019-02-25 LAB — CBC
HCT: 45.8 % (ref 39.0–52.0)
Hemoglobin: 14.6 g/dL (ref 13.0–17.0)
MCH: 31.2 pg (ref 26.0–34.0)
MCHC: 31.9 g/dL (ref 30.0–36.0)
MCV: 97.9 fL (ref 80.0–100.0)
Platelets: 235 10*3/uL (ref 150–400)
RBC: 4.68 MIL/uL (ref 4.22–5.81)
RDW: 12.4 % (ref 11.5–15.5)
WBC: 7.7 10*3/uL (ref 4.0–10.5)
nRBC: 0 % (ref 0.0–0.2)

## 2019-02-25 LAB — BASIC METABOLIC PANEL
Anion gap: 10 (ref 5–15)
BUN: 16 mg/dL (ref 8–23)
CO2: 26 mmol/L (ref 22–32)
Calcium: 9.6 mg/dL (ref 8.9–10.3)
Chloride: 105 mmol/L (ref 98–111)
Creatinine, Ser: 1.12 mg/dL (ref 0.61–1.24)
GFR calc Af Amer: >60 mL/min (ref >60)
GFR calc non Af Amer: >60 mL/min (ref >60)
Glucose, Bld: 93 mg/dL (ref 70–99)
Potassium: 5.5 mmol/L — ABNORMAL HIGH (ref 3.5–5.1)
Sodium: 141 mmol/L (ref 135–145)

## 2019-02-25 LAB — SARS CORONAVIRUS 2 (TAT 6-24 HRS): SARS Coronavirus 2: NEGATIVE

## 2019-02-25 LAB — SURGICAL PCR SCREEN
MRSA, PCR: NEGATIVE
Staphylococcus aureus: POSITIVE — AB

## 2019-02-25 NOTE — Progress Notes (Signed)
SPOKE W/  Lanae Boast     SCREENING SYMPTOMS OF COVID 19:   COUGH--NO  RUNNY NOSE--- NO  SORE THROAT---NO  NASAL CONGESTION----NO  SNEEZING----NO  SHORTNESS OF BREATH---NO  DIFFICULTY BREATHING---NO  TEMP >100.0 -----NO  UNEXPLAINED BODY ACHES------NO  CHILLS -------- NO  HEADACHES ---------NO  LOSS OF SMELL/ TASTE --------NO    HAVE YOU OR ANY FAMILY MEMBER TRAVELLED PAST 14 DAYS OUT OF THE   COUNTY---[place] IN [place]----TR[place]ELS TO [place]----NO  HAVE YOU OR ANY FAMILY MEMBER BEEN EXPOSED TO ANYONE WITH COVID 19? NO

## 2019-02-27 NOTE — Progress Notes (Signed)
Anesthesia Chart Review   Case: [id number] Date/Time: 02/28/19 0945   Procedure: TOTAL SHOULDER ARTHROPLASTY (Left ) - 166min   Anesthesia type: General   Pre-op diagnosis: left shoulder osteoarthritis   [facility]: [facility] ROOM 06 / [facility]   Surgeon: Justice Britain, MD      DISCUSSION:63 y.o. former smoker (70 pack years, quit 10/15/2008) with h/o HTN, asthma, COPD, sleep apnea w/CPAP, GERD, left shoulder OA scheduled for above procedure 02/28/2019 with Dr. Justice Britain.   Last seen by pulmonologist 08/07/2018.  Stable at this visit with 1 year follow up recommended.   Will recheck potassium DOS.   Anticipate pt can proceed with planned procedure barring acute status change.   VS: BP 136/85   Pulse 69   Temp 36.9 C (Oral)   Resp 16   Ht 5\' 10"  (1.778 m)   Wt 110.5 kg   SpO2 97%   BMI 34.97 kg/m   PROVIDERS: Asencion Noble, MD is PCP   Alisia Ferrari, MD is Pulmonologist  LABS: Potassium forwarded to surgeon and PCP, will recheck DOS.  (all labs ordered are listed, but only abnormal results are displayed)  Labs Reviewed  SURGICAL PCR SCREEN - Abnormal; Notable for the following components:      Result Value   Staphylococcus aureus POSITIVE (*)    All other components within normal limits  BASIC METABOLIC PANEL - Abnormal; Notable for the following components:   Potassium 5.5 (*)    All other components within normal limits  CBC     IMAGES:   EKG: 02/25/2019 Rate 60 bpm Normal sinus rhythm   CV:  Past Medical History:  Diagnosis Date  . Anemia   . Arthritis   . Asthma   . COPD (chronic obstructive pulmonary disease) ([place])   . Depression   . GERD (gastroesophageal reflux disease)   . High cholesterol    diet controlled  . Hypertension    diet controlled  . Sleep apnea    CPAP    Past Surgical History:  Procedure Laterality Date  . CATARACT EXTRACTION  2018  . CATARACT EXTRACTION W/PHACO Right 02/23/2015   Procedure: CATARACT EXTRACTION PHACO AND INTRAOCULAR  LENS PLACEMENT (IOC);  Surgeon: Williams Che, MD;  Location: [facility];  Service: Ophthalmology;  Laterality: Right;  CDE:5.02  . CHOLECYSTECTOMY    . COLONOSCOPY  03/15/2005   Dr. Rourk:Rectal polyps (diminutive), status post cold biopsy/removal/normal colon. hyperplastic  . COLONOSCOPY N/A 08/27/2014   Dr. Gala Romney: Multiple rectal and colonic polyps removed, one which was a 1 cm sessile polyp. Cecal AVMS ablated as described above. colonic Diverticulosis. Path with rectal hyperplastic polyp, ascending colon polyps with benign colonic mucosa, sessile serrated polyp with associated lipoma. Recommendation for surveillance colonoscopy in 3 years.   . COLONOSCOPY WITH PROPOFOL N/A 12/25/2017   Procedure: COLONOSCOPY WITH PROPOFOL;  Surgeon: Daneil Dolin, MD;  [facility];  Service: Endoscopy;  Laterality: N/A;  1:45pm  . ESOPHAGOGASTRODUODENOSCOPY N/A 08/27/2014   Dr. Rourk:Tiny distal esophageal erosions consistent with mild erosive reflux esophaigitis. Hiatal hernia. Antral erosions status post gastric biopsy. mild chronic gastritis, negative H.pylori  . GIVENS CAPSULE STUDY N/A 11/05/2014   Procedure: GIVENS CAPSULE STUDY;  Surgeon: Daneil Dolin, MD;  [facility];  Service: Endoscopy;  Laterality: N/A;  0800  . KNEE ARTHROSCOPY Right   . POLYPECTOMY  12/25/2017   Procedure: POLYPECTOMY;  Surgeon: Daneil Dolin, MD;  [facility];  Service: Endoscopy;;  MEDICATIONS: . Dextromethorphan-guaiFENesin ([place] DM PO)  . triamcinolone (NASACORT) 55 MCG/ACT AERO nasal inhaler  . acetaminophen (TYLENOL) 500 MG tablet  . Coenzyme Q10 (COQ10) 100 MG CAPS  . fluticasone-salmeterol (ADVAIR HFA) 115-21 MCG/ACT inhaler  . gabapentin (NEURONTIN) 100 MG capsule  . lansoprazole (PREVACID) 15 MG capsule  . levalbuterol (XOPENEX HFA) 45 MCG/ACT inhaler  . mirtazapine (REMERON) 15 MG tablet  . Multiple Vitamin (MULTIVITAMIN WITH MINERALS) TABS tablet  . pravastatin  (PRAVACHOL) 20 MG tablet  . traMADol (ULTRAM) 50 MG tablet  . valsartan (DIOVAN) 80 MG tablet  . zolpidem (AMBIEN) 10 MG tablet   No current facility-administered medications for this encounter.      Maia Plan [facility] Pre-Surgical Testing [phone] 02/27/19  12:44 PM

## 2019-02-28 ENCOUNTER — Observation Stay (HOSPITAL_COMMUNITY)
Admission: RE | Admit: 2019-02-28 | Discharge: 2019-03-01 | Disposition: A | Discharge status: Home / Self Care | Payer: BC Managed Care – PPO | Attending: Orthopedic Surgery | Admitting: Orthopedic Surgery

## 2019-02-28 ENCOUNTER — Other Ambulatory Visit: Payer: Self-pay

## 2019-02-28 ENCOUNTER — Ambulatory Visit (HOSPITAL_COMMUNITY): Payer: BC Managed Care – PPO | Admitting: Certified Registered Nurse Anesthetist

## 2019-02-28 ENCOUNTER — Encounter (HOSPITAL_COMMUNITY): Payer: Self-pay | Admitting: Emergency Medicine

## 2019-02-28 ENCOUNTER — Encounter (HOSPITAL_COMMUNITY)
Admission: RE | Disposition: A | Discharge status: Home / Self Care | Payer: Self-pay | Source: Home / Self Care | Attending: Orthopedic Surgery

## 2019-02-28 DIAGNOSIS — J449 Chronic obstructive pulmonary disease, unspecified: Secondary | ICD-10-CM | POA: Diagnosis not present

## 2019-02-28 DIAGNOSIS — Z79899 Other long term (current) drug therapy: Secondary | ICD-10-CM | POA: Diagnosis not present

## 2019-02-28 DIAGNOSIS — I10 Essential (primary) hypertension: Secondary | ICD-10-CM | POA: Insufficient documentation

## 2019-02-28 DIAGNOSIS — Z87891 Personal history of nicotine dependence: Secondary | ICD-10-CM | POA: Diagnosis not present

## 2019-02-28 DIAGNOSIS — K219 Gastro-esophageal reflux disease without esophagitis: Secondary | ICD-10-CM | POA: Diagnosis not present

## 2019-02-28 DIAGNOSIS — F329 Major depressive disorder, single episode, unspecified: Secondary | ICD-10-CM | POA: Insufficient documentation

## 2019-02-28 DIAGNOSIS — M25711 Osteophyte, right shoulder: Secondary | ICD-10-CM | POA: Insufficient documentation

## 2019-02-28 DIAGNOSIS — G4730 Sleep apnea, unspecified: Secondary | ICD-10-CM | POA: Diagnosis not present

## 2019-02-28 DIAGNOSIS — Z96612 Presence of left artificial shoulder joint: Secondary | ICD-10-CM

## 2019-02-28 DIAGNOSIS — E7800 Pure hypercholesterolemia, unspecified: Secondary | ICD-10-CM | POA: Insufficient documentation

## 2019-02-28 DIAGNOSIS — Z7951 Long term (current) use of inhaled steroids: Secondary | ICD-10-CM | POA: Insufficient documentation

## 2019-02-28 DIAGNOSIS — G8918 Other acute postprocedural pain: Secondary | ICD-10-CM | POA: Diagnosis not present

## 2019-02-28 DIAGNOSIS — M19012 Primary osteoarthritis, left shoulder: Secondary | ICD-10-CM | POA: Diagnosis not present

## 2019-02-28 HISTORY — PX: TOTAL SHOULDER ARTHROPLASTY: SHX126

## 2019-02-28 SURGERY — ARTHROPLASTY, SHOULDER, TOTAL
Anesthesia: General | Laterality: Left

## 2019-02-28 MED ORDER — FENTANYL CITRATE (PF) 100 MCG/2ML IJ SOLN: 25.0000 ug | INTRAMUSCULAR | Status: DC | PRN

## 2019-02-28 MED ORDER — CEFAZOLIN SODIUM-DEXTROSE 2-4 GM/100ML-% IV SOLN
2.0000 g | INTRAVENOUS | Status: AC
  Administered 2019-02-28: 2 g via INTRAVENOUS
  Filled 2019-02-28: qty 100

## 2019-02-28 MED ORDER — PHENYLEPHRINE 40 MCG/ML (10ML) SYRINGE FOR IV PUSH (FOR BLOOD PRESSURE SUPPORT)
PREFILLED_SYRINGE | INTRAVENOUS | Status: DC | PRN
  Administered 2019-02-28 (×4): 80 ug via INTRAVENOUS

## 2019-02-28 MED ORDER — MOMETASONE FURO-FORMOTEROL FUM 200-5 MCG/ACT IN AERO
2.0000 | INHALATION_SPRAY | Freq: Two times a day (BID) | RESPIRATORY_TRACT | Status: DC
  Administered 2019-02-28 – 2019-03-01 (×2): 2 via RESPIRATORY_TRACT
  Filled 2019-02-28: qty 8.8

## 2019-02-28 MED ORDER — SUCCINYLCHOLINE CHLORIDE 200 MG/10ML IV SOSY
PREFILLED_SYRINGE | INTRAVENOUS | Status: DC | PRN
  Administered 2019-02-28: 120 mg via INTRAVENOUS

## 2019-02-28 MED ORDER — ACETAMINOPHEN 500 MG PO TABS
1000.0000 mg | ORAL_TABLET | Freq: Once | ORAL | Status: AC
  Administered 2019-02-28: 08:00:00 1000 mg via ORAL
  Filled 2019-02-28: qty 2

## 2019-02-28 MED ORDER — BUPIVACAINE HCL (PF) 0.5 % IJ SOLN
INTRAMUSCULAR | Status: DC | PRN
  Administered 2019-02-28: 15 mL via PERINEURAL

## 2019-02-28 MED ORDER — FENTANYL CITRATE (PF) 100 MCG/2ML IJ SOLN
INTRAMUSCULAR | Status: DC | PRN
  Administered 2019-02-28 (×4): 25 ug via INTRAVENOUS

## 2019-02-28 MED ORDER — LACTATED RINGERS IV SOLN
INTRAVENOUS | Status: DC
  Administered 2019-02-28: 16:00:00 via INTRAVENOUS

## 2019-02-28 MED ORDER — PROPOFOL 10 MG/ML IV BOLUS
INTRAVENOUS | Status: DC | PRN
  Administered 2019-02-28: 200 mg via INTRAVENOUS

## 2019-02-28 MED ORDER — MIDAZOLAM HCL 2 MG/2ML IJ SOLN
INTRAMUSCULAR | Status: AC
  Filled 2019-02-28: qty 2

## 2019-02-28 MED ORDER — LEVALBUTEROL HCL 0.63 MG/3ML IN NEBU: 0.6300 mg | INHALATION_SOLUTION | Freq: Two times a day (BID) | RESPIRATORY_TRACT | Status: DC | PRN

## 2019-02-28 MED ORDER — PROMETHAZINE HCL 25 MG/ML IJ SOLN: 6.2500 mg | INTRAMUSCULAR | Status: DC | PRN

## 2019-02-28 MED ORDER — ACETAMINOPHEN 325 MG PO TABS: 325.0000 mg | ORAL_TABLET | Freq: Four times a day (QID) | ORAL | Status: DC | PRN

## 2019-02-28 MED ORDER — DEXAMETHASONE SODIUM PHOSPHATE 10 MG/ML IJ SOLN
INTRAMUSCULAR | Status: DC | PRN
  Administered 2019-02-28: 10 mg via INTRAVENOUS

## 2019-02-28 MED ORDER — METOCLOPRAMIDE HCL 5 MG/ML IJ SOLN: 5.0000 mg | Freq: Three times a day (TID) | INTRAMUSCULAR | Status: DC | PRN

## 2019-02-28 MED ORDER — DIPHENHYDRAMINE HCL 12.5 MG/5ML PO ELIX: 12.5000 mg | ORAL_SOLUTION | ORAL | Status: DC | PRN

## 2019-02-28 MED ORDER — ROCURONIUM BROMIDE 50 MG/5ML IV SOSY
PREFILLED_SYRINGE | INTRAVENOUS | Status: DC | PRN
  Administered 2019-02-28: 50 mg via INTRAVENOUS
  Administered 2019-02-28 (×2): 10 mg via INTRAVENOUS

## 2019-02-28 MED ORDER — IRBESARTAN 75 MG PO TABS
75.0000 mg | ORAL_TABLET | Freq: Every day | ORAL | Status: DC
  Filled 2019-02-28: qty 1

## 2019-02-28 MED ORDER — PROPOFOL 10 MG/ML IV BOLUS
INTRAVENOUS | Status: AC
  Filled 2019-02-28: qty 20

## 2019-02-28 MED ORDER — DOCUSATE SODIUM 100 MG PO CAPS
100.0000 mg | ORAL_CAPSULE | Freq: Two times a day (BID) | ORAL | Status: DC
  Administered 2019-02-28 – 2019-03-01 (×2): 100 mg via ORAL
  Filled 2019-02-28 (×2): qty 1

## 2019-02-28 MED ORDER — ONDANSETRON HCL 4 MG PO TABS: 4.0000 mg | ORAL_TABLET | Freq: Four times a day (QID) | ORAL | Status: DC | PRN

## 2019-02-28 MED ORDER — KETOROLAC TROMETHAMINE 15 MG/ML IJ SOLN
15.0000 mg | Freq: Four times a day (QID) | INTRAMUSCULAR | Status: AC
  Administered 2019-02-28 – 2019-03-01 (×4): 15 mg via INTRAVENOUS
  Filled 2019-02-28 (×4): qty 1

## 2019-02-28 MED ORDER — CHLORHEXIDINE GLUCONATE 4 % EX LIQD: 60.0000 mL | Freq: Once | CUTANEOUS | Status: DC

## 2019-02-28 MED ORDER — ONDANSETRON HCL 4 MG/2ML IJ SOLN
INTRAMUSCULAR | Status: AC
  Filled 2019-02-28: qty 2

## 2019-02-28 MED ORDER — TRANEXAMIC ACID-NACL 1000-0.7 MG/100ML-% IV SOLN
1000.0000 mg | INTRAVENOUS | Status: AC
  Administered 2019-02-28: 1000 mg via INTRAVENOUS
  Filled 2019-02-28: qty 100

## 2019-02-28 MED ORDER — LIDOCAINE 2% (20 MG/ML) 5 ML SYRINGE
INTRAMUSCULAR | Status: DC | PRN
  Administered 2019-02-28: 40 mg via INTRAVENOUS

## 2019-02-28 MED ORDER — ONDANSETRON HCL 4 MG/2ML IJ SOLN
INTRAMUSCULAR | Status: DC | PRN
  Administered 2019-02-28: 4 mg via INTRAVENOUS

## 2019-02-28 MED ORDER — METHOCARBAMOL 500 MG IVPB - SIMPLE MED
500.0000 mg | Freq: Four times a day (QID) | INTRAVENOUS | Status: DC | PRN
  Filled 2019-02-28: qty 50

## 2019-02-28 MED ORDER — PANTOPRAZOLE SODIUM 40 MG PO TBEC
40.0000 mg | DELAYED_RELEASE_TABLET | Freq: Every day | ORAL | Status: DC
  Administered 2019-03-01: 40 mg via ORAL
  Filled 2019-02-28: qty 1

## 2019-02-28 MED ORDER — METOCLOPRAMIDE HCL 5 MG PO TABS: 5.0000 mg | ORAL_TABLET | Freq: Three times a day (TID) | ORAL | Status: DC | PRN

## 2019-02-28 MED ORDER — METHOCARBAMOL 500 MG PO TABS
500.0000 mg | ORAL_TABLET | Freq: Four times a day (QID) | ORAL | Status: DC | PRN
  Administered 2019-03-01: 500 mg via ORAL
  Filled 2019-02-28: qty 1

## 2019-02-28 MED ORDER — PHENYLEPHRINE 40 MCG/ML (10ML) SYRINGE FOR IV PUSH (FOR BLOOD PRESSURE SUPPORT)
PREFILLED_SYRINGE | INTRAVENOUS | Status: AC
  Filled 2019-02-28: qty 10

## 2019-02-28 MED ORDER — MENTHOL 3 MG MT LOZG: 1.0000 | LOZENGE | OROMUCOSAL | Status: DC | PRN

## 2019-02-28 MED ORDER — GABAPENTIN 100 MG PO CAPS
100.0000 mg | ORAL_CAPSULE | Freq: Three times a day (TID) | ORAL | Status: DC
  Administered 2019-02-28 – 2019-03-01 (×3): 100 mg via ORAL
  Filled 2019-02-28 (×3): qty 1

## 2019-02-28 MED ORDER — 0.9 % SODIUM CHLORIDE (POUR BTL) OPTIME
TOPICAL | Status: DC | PRN
  Administered 2019-02-28: 10:00:00 1000 mL

## 2019-02-28 MED ORDER — MIDAZOLAM HCL 2 MG/2ML IJ SOLN
1.0000 mg | INTRAMUSCULAR | Status: DC
  Administered 2019-02-28: 2 mg via INTRAVENOUS
  Filled 2019-02-28: qty 2

## 2019-02-28 MED ORDER — MIRTAZAPINE 15 MG PO TABS
15.0000 mg | ORAL_TABLET | Freq: Every day | ORAL | Status: DC
  Administered 2019-02-28: 15 mg via ORAL
  Filled 2019-02-28: qty 1

## 2019-02-28 MED ORDER — CELECOXIB 200 MG PO CAPS
400.0000 mg | ORAL_CAPSULE | Freq: Once | ORAL | Status: AC
  Administered 2019-02-28: 400 mg via ORAL
  Filled 2019-02-28: qty 2

## 2019-02-28 MED ORDER — POLYETHYLENE GLYCOL 3350 17 G PO PACK: 17.0000 g | PACK | Freq: Every day | ORAL | Status: DC | PRN

## 2019-02-28 MED ORDER — SCOPOLAMINE 1 MG/3DAYS TD PT72
1.0000 | MEDICATED_PATCH | TRANSDERMAL | Status: DC
  Administered 2019-02-28: 1.5 mg via TRANSDERMAL
  Filled 2019-02-28: qty 1

## 2019-02-28 MED ORDER — LEVALBUTEROL TARTRATE 45 MCG/ACT IN AERO: 1.0000 | INHALATION_SPRAY | Freq: Two times a day (BID) | RESPIRATORY_TRACT | Status: DC | PRN

## 2019-02-28 MED ORDER — HYDROMORPHONE HCL 1 MG/ML IJ SOLN: 0.5000 mg | INTRAMUSCULAR | Status: DC | PRN

## 2019-02-28 MED ORDER — ZOLPIDEM TARTRATE 5 MG PO TABS
5.0000 mg | ORAL_TABLET | Freq: Every evening | ORAL | Status: DC | PRN
  Administered 2019-03-01: 5 mg via ORAL
  Filled 2019-02-28: qty 1

## 2019-02-28 MED ORDER — DEXAMETHASONE SODIUM PHOSPHATE 10 MG/ML IJ SOLN
INTRAMUSCULAR | Status: AC
  Filled 2019-02-28: qty 1

## 2019-02-28 MED ORDER — ROCURONIUM BROMIDE 10 MG/ML (PF) SYRINGE
PREFILLED_SYRINGE | INTRAVENOUS | Status: AC
  Filled 2019-02-28: qty 10

## 2019-02-28 MED ORDER — TRAMADOL HCL 50 MG PO TABS
50.0000 mg | ORAL_TABLET | Freq: Four times a day (QID) | ORAL | Status: DC | PRN
  Administered 2019-02-28 – 2019-03-01 (×2): 50 mg via ORAL
  Filled 2019-02-28 (×2): qty 1

## 2019-02-28 MED ORDER — LACTATED RINGERS IV SOLN
INTRAVENOUS | Status: DC
  Administered 2019-02-28 (×2): via INTRAVENOUS

## 2019-02-28 MED ORDER — OXYCODONE HCL 5 MG PO TABS: 10.0000 mg | ORAL_TABLET | ORAL | Status: DC | PRN

## 2019-02-28 MED ORDER — ONDANSETRON HCL 4 MG/2ML IJ SOLN: 4.0000 mg | Freq: Four times a day (QID) | INTRAMUSCULAR | Status: DC | PRN

## 2019-02-28 MED ORDER — FLEET ENEMA 7-19 GM/118ML RE ENEM: 1.0000 | ENEMA | Freq: Once | RECTAL | Status: DC | PRN

## 2019-02-28 MED ORDER — BISACODYL 5 MG PO TBEC: 5.0000 mg | DELAYED_RELEASE_TABLET | Freq: Every day | ORAL | Status: DC | PRN

## 2019-02-28 MED ORDER — FENTANYL CITRATE (PF) 100 MCG/2ML IJ SOLN
50.0000 ug | INTRAMUSCULAR | Status: DC
  Administered 2019-02-28: 100 ug via INTRAVENOUS
  Filled 2019-02-28: qty 2

## 2019-02-28 MED ORDER — BUPIVACAINE LIPOSOME 1.3 % IJ SUSP
INTRAMUSCULAR | Status: DC | PRN
  Administered 2019-02-28: 10 mL via PERINEURAL

## 2019-02-28 MED ORDER — ALUM & MAG HYDROXIDE-SIMETH 200-200-20 MG/5ML PO SUSP: 30.0000 mL | ORAL | Status: DC | PRN

## 2019-02-28 MED ORDER — STERILE WATER FOR IRRIGATION IR SOLN
Status: DC | PRN
  Administered 2019-02-28: 2000 mL

## 2019-02-28 MED ORDER — OXYCODONE HCL 5 MG PO TABS: 5.0000 mg | ORAL_TABLET | ORAL | Status: DC | PRN

## 2019-02-28 MED ORDER — EPHEDRINE SULFATE-NACL 50-0.9 MG/10ML-% IV SOSY
PREFILLED_SYRINGE | INTRAVENOUS | Status: DC | PRN
  Administered 2019-02-28 (×4): 10 mg via INTRAVENOUS

## 2019-02-28 MED ORDER — FENTANYL CITRATE (PF) 100 MCG/2ML IJ SOLN
INTRAMUSCULAR | Status: AC
  Filled 2019-02-28: qty 2

## 2019-02-28 MED ORDER — PHENOL 1.4 % MT LIQD
1.0000 | OROMUCOSAL | Status: DC | PRN
  Filled 2019-02-28: qty 177

## 2019-02-28 MED ORDER — SUGAMMADEX SODIUM 500 MG/5ML IV SOLN
INTRAVENOUS | Status: DC | PRN
  Administered 2019-02-28: 220 mg via INTRAVENOUS

## 2019-02-28 MED ORDER — LIDOCAINE 2% (20 MG/ML) 5 ML SYRINGE
INTRAMUSCULAR | Status: AC
  Filled 2019-02-28: qty 5

## 2019-02-28 SURGICAL SUPPLY — 60 items
ADH SKN CLS APL DERMABOND .7 (GAUZE/BANDAGES/DRESSINGS) ×1
AID PSTN UNV HD RSTRNT DISP (MISCELLANEOUS) ×1
BAG SPEC THK2 15X12 ZIP CLS (MISCELLANEOUS) ×1
BAG ZIPLOCK 12X15 (MISCELLANEOUS) ×3 IMPLANT
BIT DRILL 2.0X128 (BIT) ×2 IMPLANT
BIT DRILL 2.0X128MM (BIT) ×1
BLADE SAW SGTL 83.5X18.5 (BLADE) ×3 IMPLANT
CALIBRATOR GLENOID VIP 5-D (SYSTAGENIX WOUND MANAGEMENT) ×2 IMPLANT
CEMENT BONE DEPUY (Cement) ×3 IMPLANT
COOLER ICEMAN CLASSIC (MISCELLANEOUS) ×2 IMPLANT
COVER BACK TABLE 60X90IN (DRAPES) ×3 IMPLANT
COVER SURGICAL LIGHT HANDLE (MISCELLANEOUS) ×3 IMPLANT
COVER WAND RF STERILE (DRAPES) IMPLANT
DERMABOND ADVANCED (GAUZE/BANDAGES/DRESSINGS) ×2
DERMABOND ADVANCED .7 DNX12 (GAUZE/BANDAGES/DRESSINGS) ×1 IMPLANT
DRAPE ORTHO SPLIT 77X108 STRL (DRAPES) ×6
DRAPE SHEET LG 3/4 BI-LAMINATE (DRAPES) ×3 IMPLANT
DRAPE SURG 17X11 SM STRL (DRAPES) ×3 IMPLANT
DRAPE SURG ORHT 6 SPLT 77X108 (DRAPES) ×2 IMPLANT
DRAPE U-SHAPE 47X51 STRL (DRAPES) ×3 IMPLANT
DRSG AQUACEL AG ADV 3.5X10 (GAUZE/BANDAGES/DRESSINGS) ×3 IMPLANT
DURAPREP 26ML APPLICATOR (WOUND CARE) ×6 IMPLANT
ELECT BLADE TIP CTD 4 INCH (ELECTRODE) ×3 IMPLANT
ELECT REM PT RETURN 15FT ADLT (MISCELLANEOUS) ×3 IMPLANT
FACESHIELD WRAPAROUND (MASK) ×12 IMPLANT
FACESHIELD WRAPAROUND OR TEAM (MASK) ×4 IMPLANT
GLENOID UNI VAULTLOCK LRG (Shoulder) ×2 IMPLANT
GOWN STRL REUS W/TWL LRG LVL3 (GOWN DISPOSABLE) ×6 IMPLANT
HEAD HUMERAL UNIVERS II 48/19 (Head) ×2 IMPLANT
KIT BASIN OR (CUSTOM PROCEDURE TRAY) ×3 IMPLANT
KIT TURNOVER KIT A (KITS) IMPLANT
MANIFOLD NEPTUNE II (INSTRUMENTS) ×3 IMPLANT
MARKER SKIN DUAL TIP RULER LAB (MISCELLANEOUS) ×3 IMPLANT
NDL TAPERED W/ NITINOL LOOP (MISCELLANEOUS) ×1 IMPLANT
NEEDLE TAPERED W/ NITINOL LOOP (MISCELLANEOUS) ×3 IMPLANT
NS IRRIG 1000ML POUR BTL (IV SOLUTION) ×3 IMPLANT
PACK SHOULDER (CUSTOM PROCEDURE TRAY) ×3 IMPLANT
PAD COLD SHLDR WRAP-ON (PAD) ×2 IMPLANT
PIN NITINOL TARGETER 2.8 (PIN) ×2 IMPLANT
PROTECTOR NERVE ULNAR (MISCELLANEOUS) ×3 IMPLANT
RESTRAINT HEAD UNIVERSAL NS (MISCELLANEOUS) ×3 IMPLANT
SLING ARM FOAM STRAP LRG (SOFTGOODS) ×2 IMPLANT
SLING ARM FOAM STRAP MED (SOFTGOODS) ×2 IMPLANT
SMARTMIX MINI TOWER (MISCELLANEOUS) ×3
SPONGE LAP 18X18 RF (DISPOSABLE) IMPLANT
STEM HUMERAL UNI APEX 10MM (Shoulder) ×2 IMPLANT
SUCTION FRAZIER HANDLE 12FR (TUBING) ×2
SUCTION TUBE FRAZIER 12FR DISP (TUBING) ×1 IMPLANT
SUT FIBERWIRE #2 38 T-5 BLUE (SUTURE) ×6
SUT MNCRL AB 3-0 PS2 18 (SUTURE) ×3 IMPLANT
SUT MON AB 2-0 CT1 36 (SUTURE) ×3 IMPLANT
SUT VIC AB 1 CT1 36 (SUTURE) ×9 IMPLANT
SUTURE FIBERWR #2 38 T-5 BLUE (SUTURE) ×1 IMPLANT
SUTURE TAPE 1.3 40 TPR END (SUTURE) ×4 IMPLANT
SUTURETAPE 1.3 40 TPR END (SUTURE) ×9
TOWEL OR 17X26 10 PK STRL BLUE (TOWEL DISPOSABLE) ×3 IMPLANT
TOWEL OR NON WOVEN STRL DISP B (DISPOSABLE) ×3 IMPLANT
TOWER SMARTMIX MINI (MISCELLANEOUS) IMPLANT
WATER STERILE IRR 1000ML POUR (IV SOLUTION) ×6 IMPLANT
YANKAUER SUCT BULB TIP 10FT TU (MISCELLANEOUS) ×3 IMPLANT

## 2019-02-28 NOTE — Op Note (Signed)
02/28/2019  11:48 AM  PATIENT:   Ryan [name]  63 y.o. male  PRE-OPERATIVE DIAGNOSIS:  left shoulder osteoarthritis  POST-OPERATIVE DIAGNOSIS: Same  PROCEDURE: Left anatomic total shoulder arthroplasty utilizing a press-fit size 9 Arthrex stem with a 48 x 19 eccentric head and a large glenoid  SURGEON:  Ewin Rehberg, Metta Clines M.D.  ASSISTANTS: Jenetta Loges, PA-C  ANESTHESIA:   General endotracheal and interscalene block with Exparel  EBL: 200 cc  SPECIMEN: None  Drains: None   PATIENT DISPOSITION:  PACU - hemodynamically stable.    PLAN OF CARE: Admit for overnight observation  Brief history:  [name] has had chronic and progressively increasing left shoulder pain related to end stage left shoulder osteoarthritis.  Clinical exam demonstrates severely restricted and painful arc of motion.  Plain radiographs confirm end-stage arthritis with complete loss of joint space.  Preoperative CT scan was utilized for templating of prosthetic position.  He is brought to the operating this time for planned left total shoulder arthroplasty  Preoperatively I counseled Ryan [name] regarding treatment options as well as the potential risks versus benefits thereof.  Possible surgical complications were reviewed including bleeding, infection, neurovascular injury, persistent pain, anesthetic complication, failure the implant, and possible need for additional surgery.  He understands, and accepts, and agrees with our planned procedure.  Procedure in detail:  After undergoing routine preop evaluation patient received prophylactic antibiotics and interscalene block with Exparel was established in the holding area by the anesthesia department.  Placed supine on the operating table and underwent the smooth induction of a general endotracheal anesthesia.  Placed into the beachchair position and appropriately padded and protected.  Left shoulder girdle region was sterilely prepped and draped in standard  fashion.  Timeout was called.  An anterior deltopectoral approach left shoulder is made through 10 cm incision.  Skin flaps were elevated and dissection carried deeply with electrocautery used for hemostasis.  The deltopectoral interval was then developed from proximal to distal with the vein taken laterally adhesions divided beneath the deltoid conjoined tendon mobilized retracted medially and the upper centimeter and half of the pectoralis major was tenotomized to enhance exposure.  The long head biceps tendon was then tenodesed at the upper border of the pectoralis major tendon and that was divided with the proximal segment unroofed and excised.  I then outlined the superior and inferior margins of the subscapularis insertion to the lesser tuberosity and coagulated the anterior humeral circumflex vessels.  An oscillating saw was then used to perform a lesser tuberosity osteotomy removing a wafer of bone and the subscap was then mobilized tagged and reflected medially.  The capsular attachments on the anterior and inferior margins of the humeral neck were then divided in a subperiosteal fashion and the humeral head was then delivered through the wound.  The extra medullary guide was then used to outline our proposed humeral head resection which we performed with an oscillating saw taking care to protect the rotator cuff superiorly and posteriorly and then the residual osteophytes on the margin of the humeral neck were removed with a rondure.  The humeral canal was then prepared hand reaming to size 7 broaching to a size 9 with excellent fit and fixation matching the native retroversion that we had cut at approximate 20 degrees.  A trial prosthesis with metal cap was then placed into the humeral canal and at this point we approached the glenoid using appropriate retractors to gain exposure and then performed a circumferential labral resection  gaining complete visualization the periphery of the glenoid.  This was  sized at a large and we utilized our glenoid guide that we had set based on our preoperative CT scan templating and guide was then positioned and guidepin directed into the center of the glenoid correcting some mild retroversion and once the guidepin was placed we reamed the glenoid to a stable subchondral bony bed.  Our central hole was drilled and the glenoid guide was then utilized to create our superior and inferior peg and slot respectively implant was then broached and a trial showed excellent fit and fixation.  This point the joint was copes irrigated.  Cement was mixed and was then introduced into the superior peg and slot respectively and the final glenoid was impacted into position with excellent fit fixation.  Should mention that we did place morselized cancellus bone about the central peg of the glenoid prior to implantation.  Once completed we returned our attention to the proximal humerus where the canal was irrigated we selected the final size 9 stem and then passed a series of sutures through the eyelets both medially and laterally to allow for the repair of her lesser tuberosity osteotomy.  The implant was then provisionally seated and all of the suture limbs were then passed through drill holes one medial and to lateral through the bicipital groove and once these were passed for the LTO repair of the stem was then terminally seated with excellent fit and fixation and the proximal locking screws were tightened.  At this point we performed a series of trial reductions and ultimately felt that the 48 x 19 eccentric head gave [place] the best proximal coverage with excellent soft tissue balance and approximate 50% translation of the humeral head of the glenoid.  The final 48 x 19 head was then positioned and impacted and the final reduction was then completed.  The joint was copiously irrigated.  At this point we repaired the subscapularis with the lesser tuberosity osteotomy back to the donor site on the  proximal humerus utilizing the previously placed suture limbs which allowed [place] to use a combination of transverse and crossing sutures which allowed excellent stable reapproximation of the LTO bone fragment to the metaphysis with the construct much our satisfaction.  The rotator interval was then repaired with a pair of figure-of-eight suture tape sutures allowing excellent apposition.  Upon completion we are easily able to achieve 30 degrees of external rotation without excessive tension on the subscapularis repair.  Irrigation was again performed hemostasis was obtained.  The deltopectoral interval was reapproximated with a series of figure-of-eight and 1 Vicryl sutures.  2-0 Vicryl used with subcu layer and intracuticular 3-0 Monocryl for the skin followed by Dermabond and Aquasol dressing.  Left arm was placed into a sling and the patient was awakened, extubated, and taken to the recovery room in stable addition.  Jenetta Loges, PA-C was used as an Environmental consultant throughout this case essential for help with positioning the patient, positioning extremity, tissue manipulation, implantation prosthesis, wound closure, and intraoperative decision-making.  Metta Clines Naira Standiford MD   Contact # [phone]

## 2019-02-28 NOTE — H&P (Signed)
Ryan [name]    Chief Complaint: left shoulder osteoarthritis HPI: The patient is a 63 y.o. male with end stage left shoulder osteoarthritis and progressively increasing pain and functional limitations  Past Medical History:  Diagnosis Date  . Anemia   . Arthritis   . Asthma   . COPD (chronic obstructive pulmonary disease) ([place])   . Depression   . GERD (gastroesophageal reflux disease)   . High cholesterol    diet controlled  . Hypertension    diet controlled  . Sleep apnea    CPAP    Past Surgical History:  Procedure Laterality Date  . CATARACT EXTRACTION  2018  . CATARACT EXTRACTION W/PHACO Right 02/23/2015   Procedure: CATARACT EXTRACTION PHACO AND INTRAOCULAR LENS PLACEMENT (IOC);  Surgeon: Williams Che, MD;  Location: [facility];  Service: Ophthalmology;  Laterality: Right;  CDE:5.02  . CHOLECYSTECTOMY    . COLONOSCOPY  03/15/2005   Dr. Rourk:Rectal polyps (diminutive), status post cold biopsy/removal/normal colon. hyperplastic  . COLONOSCOPY N/A 08/27/2014   Dr. Gala Romney: Multiple rectal and colonic polyps removed, one which was a 1 cm sessile polyp. Cecal AVMS ablated as described above. colonic Diverticulosis. Path with rectal hyperplastic polyp, ascending colon polyps with benign colonic mucosa, sessile serrated polyp with associated lipoma. Recommendation for surveillance colonoscopy in 3 years.   . COLONOSCOPY WITH PROPOFOL N/A 12/25/2017   Procedure: COLONOSCOPY WITH PROPOFOL;  Surgeon: Daneil Dolin, MD;  Location: [facility];  Service: Endoscopy;  Laterality: N/A;  1:45pm  . ESOPHAGOGASTRODUODENOSCOPY N/A 08/27/2014   Dr. Rourk:Tiny distal esophageal erosions consistent with mild erosive reflux esophaigitis. Hiatal hernia. Antral erosions status post gastric biopsy. mild chronic gastritis, negative H.pylori  . GIVENS CAPSULE STUDY N/A 11/05/2014   Procedure: GIVENS CAPSULE STUDY;  Surgeon: Daneil Dolin, MD;  Location: [facility];  Service: Endoscopy;   Laterality: N/A;  0800  . KNEE ARTHROSCOPY Right   . POLYPECTOMY  12/25/2017   Procedure: POLYPECTOMY;  Surgeon: Daneil Dolin, MD;  Location: [facility];  Service: Endoscopy;;    Family History  Problem Relation Age of Onset  . Cancer Mother        unsure primary  . Colon cancer Neg Hx     Social History:  reports that he quit smoking about 10 years ago. His smoking use included cigarettes. He has a 70.00 pack-year smoking history. He has never used smokeless tobacco. He reports current alcohol use of about 2.0 standard drinks of alcohol per week. He reports that he does not use drugs.   Medications Prior to Admission  Medication Sig Dispense Refill  . acetaminophen (TYLENOL) 500 MG tablet Take 500 mg by mouth 3 (three) times daily.     . Coenzyme Q10 (COQ10) 100 MG CAPS Take 100 mg by mouth daily.     Marland Kitchen Dextromethorphan-guaiFENesin (MUCINEX DM PO) Take by mouth daily.    . fluticasone-salmeterol (ADVAIR HFA) 115-21 MCG/ACT inhaler Inhale 2 puffs into the lungs 2 (two) times daily.     Marland Kitchen gabapentin (NEURONTIN) 100 MG capsule Take 100 mg by mouth 3 (three) times daily.     . lansoprazole (PREVACID) 15 MG capsule Take 15 mg by mouth every morning.     . mirtazapine (REMERON) 15 MG tablet Take 15 mg by mouth at bedtime.     . Multiple Vitamin (MULTIVITAMIN WITH MINERALS) TABS tablet Take 1 tablet by mouth daily. Centrum Silver for 50+    . pravastatin (PRAVACHOL) 20 MG tablet Take  20 mg by mouth every evening.    . traMADol (ULTRAM) 50 MG tablet Take 50 mg by mouth 3 (three) times daily.     Marland Kitchen triamcinolone (NASACORT) 55 MCG/ACT AERO nasal inhaler Place 2 sprays into the nose as needed.    . valsartan (DIOVAN) 80 MG tablet Take 80 mg by mouth daily.    Marland Kitchen zolpidem (AMBIEN) 10 MG tablet Take 10 mg by mouth at bedtime as needed for sleep.     Marland Kitchen levalbuterol (XOPENEX HFA) 45 MCG/ACT inhaler Inhale 1 puff into the lungs 2 (two) times daily as needed for wheezing or shortness of breath.         Physical Exam: Left shoulder demonstrates a painful and guarded range of motion as noted at his recent office visit.  Plain radiographs confirm end-stage arthritis with complete obliteration of the joint space, subchondral sclerosis, and peripheral osteophyte formation.  A preoperative CT scan was obtained to help with operative planning.  Vitals  Temp:  [98.2 F (36.8 C)] 98.2 F (36.8 C) (08/20 0829) Pulse Rate:  [72-81] 76 (08/20 0903) Resp:  [10-18] 14 (08/20 0903) BP: (123-145)/(74-84) 130/79 (08/20 0903) SpO2:  [94 %-99 %] 94 % (08/20 0903) Weight:  [110.5 kg] 110.5 kg (08/20 0825)  Assessment/Plan  Impression: left shoulder osteoarthritis  Plan of Action: Procedure(s): TOTAL SHOULDER ARTHROPLASTY  [name] M Cammy Sanjurjo 02/28/2019, 9:13 AM Contact # [phone]

## 2019-02-28 NOTE — Transfer of Care (Signed)
Immediate Anesthesia Transfer of Care Note  Patient: Ryan [name]  Procedure(s) Performed: TOTAL SHOULDER ARTHROPLASTY (Left )  Patient Location: PACU  Anesthesia Type:GA combined with regional for post-op pain  Level of Consciousness: awake, alert , oriented and patient cooperative  Airway & Oxygen Therapy: Patient Spontanous Breathing and Patient connected to face mask oxygen  Post-op Assessment: Report given to RN and Post -op Vital signs reviewed and stable  Post vital signs: Reviewed and stable  Last Vitals:  Vitals Value Taken Time  BP    Temp    Pulse 102 02/28/19 1212  Resp 14 02/28/19 1212  SpO2 99 % 02/28/19 1212  Vitals shown include unvalidated device data.  Last Pain:  Vitals:   02/28/19 0829  TempSrc: Oral  PainSc:       Patients Stated Pain Goal: 4 (42/59/56 [id number])  Complications: No apparent anesthesia complications

## 2019-02-28 NOTE — Anesthesia Procedure Notes (Signed)
Anesthesia Regional Block: Interscalene brachial plexus block   Pre-Anesthetic Checklist: ,, timeout performed, Correct Patient, Correct Site, Correct Laterality, Correct Procedure, Correct Position, site marked, Risks and benefits discussed,  Surgical consent,  Pre-op evaluation,  At surgeon's request and post-op pain management  Laterality: Left  Prep: chloraprep       Needles:  Injection technique: Single-shot  Needle Type: Echogenic Stimulator Needle     Needle Length: 5cm  Needle Gauge: 22     Additional Needles:   Narrative:  Start time: 02/28/2019 8:53 AM End time: 02/28/2019 9:03 AM Injection made incrementally with aspirations every 5 mL.  Performed by: Personally  Anesthesiologist: Duane Boston, MD  Additional Notes: Functioning IV was confirmed and monitors applied.  A 52mm 22ga echogenic arrow stimulator was used. Sterile prep and drape,hand hygiene and sterile gloves were used.Ultrasound guidance: relevant anatomy identified, needle position confirmed, local anesthetic spread visualized around nerve(s)., vascular puncture avoided.  Image printed for medical record.  Negative aspiration and negative test dose prior to incremental administration of local anesthetic. The patient tolerated the procedure well.

## 2019-02-28 NOTE — Discharge Instructions (Signed)
° °Kevin M. Supple, M.D., F.A.A.O.S. °Orthopaedic Surgery °Specializing in Arthroscopic and Reconstructive °Surgery of the Shoulder °[phone] °[address] - [place], [place] [address] - Fax [phone] ° ° °POST-OP TOTAL SHOULDER REPLACEMENT INSTRUCTIONS ° °1. Call the office at [phone] to schedule your first post-op appointment 10-14 days from the date of your surgery. ° °2. The bandage over your incision is waterproof. You may begin showering with this dressing on. You may leave this dressing on until first follow up appointment within 2 weeks. We prefer you leave this dressing in place until follow up however after 5-7 days if you are having itching or skin irritation and would like to remove it you may do so. Go slow and tug at the borders gently to break the bond the dressing has with the skin. At this point if there is no drainage it is okay to go without a bandage or you may cover it with a light guaze and tape. You can also expect significant bruising around your shoulder that will drift down your arm and into your chest wall. This is very normal and should resolve over several days. ° ° 3. Wear your sling/immobilizer at all times except to perform the exercises below or to occasionally let your arm dangle by your side to stretch your elbow. You also need to sleep in your sling immobilizer until instructed otherwise. It is ok to remove your sling if you are sitting in a controlled environment and allow your arm to rest in a position of comfort by your side or on your lap with pillows to give your neck and skin a break from the sling. You may remove it to allow arm to dangle by side to shower. If you are up walking around and when you go to sleep at night you need to wear it. ° °4. Range of motion to your elbow, wrist, and hand are encouraged 3-5 times daily. Exercise to your hand and fingers helps to reduce swelling you may experience. ° °5. Utilize ice to the shoulder 3-5 times  minimum a day and additionally if you are experiencing pain. ° °6. Prescriptions for a pain medication and a muscle relaxant are provided for you. It is recommended that if you are experiencing pain that you pain medication alone is not controlling, add the muscle relaxant along with the pain medication which can give additional pain relief. The first 1-2 days is generally the most severe of your pain and then should gradually decrease. As your pain lessens it is recommended that you decrease your use of the pain medications to an "as needed basis'" only and to always comply with the recommended dosages of the pain medications. ° °7. Pain medications can produce constipation along with their use. If you experience this, the use of an over the counter stool softener or laxative daily is recommended.  ° °8. For additional questions or concerns, please do not hesitate to call the office. If after hours there is an answering service to forward your concerns to the physician on call. ° °9.Pain control following an exparel block ° °To help control your post-operative pain you received a nerve block  performed with Exparel which is a long acting anesthetic (numbing agent) which can provide pain relief and sensations of numbness (and relief of pain) in the operative shoulder and arm for up to 3 days. Sometimes it provides mixed relief, meaning you may still have numbness in certain areas of the arm but can still   be able to move  parts of that arm, hand, and fingers. We recommend that your prescribed pain medications  be used as needed. We do not feel it is necessary to "pre medicate" and "stay ahead" of pain.  Taking narcotic pain medications when you are not having any pain can lead to unnecessary and potentially dangerous side effects.    10. Use the ice machine as much as possible in the first 5-7 days from surgery, then you can wean its use to as needed. The ice typically needs to be replaced every 6 hours, instead of  ice you can actually freeze water bottles to put in the cooler and then fill water around them to avoid having to purchase ice. You can have spare water bottles freezing to allow you to rotate them once they have melted. Try to have a thin shirt or light cloth or towel under the ice wrap to protect your skin.   POST-OP EXERCISES  Pendulum Exercises  Perform pendulum exercises while standing and bending at the waist. Support your uninvolved arm on a table or chair and allow your operated arm to hang freely. Make sure to do these exercises passively - not using you shoulder muscles.  Repeat 20 times. Do 3 sessions per day.

## 2019-02-28 NOTE — Progress Notes (Signed)
Assisted Dr. Singer with left, ultrasound guided, interscalene  block. Side rails up, monitors on throughout procedure. See vital signs in flow sheet. Tolerated Procedure well.  

## 2019-02-28 NOTE — Anesthesia Postprocedure Evaluation (Signed)
Anesthesia Post Note  Patient: Ryan [name]  Procedure(s) Performed: TOTAL SHOULDER ARTHROPLASTY (Left )     Patient location during evaluation: PACU Anesthesia Type: General Level of consciousness: sedated Pain management: pain level controlled Vital Signs Assessment: post-procedure vital signs reviewed and stable Respiratory status: spontaneous breathing and respiratory function stable Cardiovascular status: stable Postop Assessment: no apparent nausea or vomiting Anesthetic complications: no    Last Vitals:  Vitals:   02/28/19 1300 02/28/19 1315  BP: 124/81 116/82  Pulse: 95 84  Resp: 10 (!) 9  Temp:  36.7 C  SpO2: 96% 93%    Last Pain:  Vitals:   02/28/19 1315  TempSrc:   PainSc: 0-No pain                 Christelle Igoe DANIEL

## 2019-02-28 NOTE — Anesthesia Procedure Notes (Signed)
Procedure Name: Intubation Date/Time: 02/28/2019 9:52 AM Performed by: West Pugh, CRNA Pre-anesthesia Checklist: Patient identified, Emergency Drugs available, Suction available, Patient being monitored and Timeout performed Patient Re-evaluated:Patient Re-evaluated prior to induction Oxygen Delivery Method: Circle system utilized Preoxygenation: Pre-oxygenation with 100% oxygen Induction Type: IV induction, Cricoid Pressure applied and Rapid sequence Laryngoscope Size: Mac and 3 Grade View: Grade II Tube type: Oral Tube size: 7.5 mm Number of attempts: 1 Airway Equipment and Method: Stylet Placement Confirmation: ETT inserted through vocal cords under direct vision,  positive ETCO2,  CO2 detector and breath sounds checked- equal and bilateral Secured at: 23 cm Tube secured with: Tape Dental Injury: Teeth and Oropharynx as per pre-operative assessment

## 2019-02-28 NOTE — Anesthesia Preprocedure Evaluation (Addendum)
Anesthesia Evaluation  Patient identified by MRN, date of birth, ID band Patient awake    Reviewed: Allergy & Precautions, NPO status , Patient's Chart, lab work & pertinent test results  Airway Mallampati: III  TM Distance: >3 FB     Dental no notable dental hx. (+) Dental Advisory Given   Pulmonary asthma , sleep apnea and Continuous Positive Airway Pressure Ventilation , COPD, former smoker,    Pulmonary exam normal        Cardiovascular hypertension, Normal cardiovascular exam     Neuro/Psych PSYCHIATRIC DISORDERS Depression negative neurological ROS     GI/Hepatic Neg liver ROS, GERD  ,  Endo/Other  negative endocrine ROS  Renal/GU negative Renal ROS     Musculoskeletal negative musculoskeletal ROS (+)   Abdominal   Peds  Hematology negative hematology ROS (+)   Anesthesia Other Findings Day of surgery medications reviewed with the patient.  Reproductive/Obstetrics                            Anesthesia Physical Anesthesia Plan  ASA: II  Anesthesia Plan: General   Post-op Pain Management:  Regional for Post-op pain   Induction: Intravenous  PONV Risk Score and Plan: 2 and Ondansetron and Dexamethasone  Airway Management Planned: Oral ETT  Additional Equipment:   Intra-op Plan:   Post-operative Plan: Extubation in OR  Informed Consent: I have reviewed the patients History and Physical, chart, labs and discussed the procedure including the risks, benefits and alternatives for the proposed anesthesia with the patient or authorized representative who has indicated his/her understanding and acceptance.     Dental advisory given  Plan Discussed with: CRNA and Anesthesiologist  Anesthesia Plan Comments:        Anesthesia Quick Evaluation

## 2019-03-01 ENCOUNTER — Encounter (HOSPITAL_COMMUNITY): Payer: Self-pay | Admitting: Orthopedic Surgery

## 2019-03-01 DIAGNOSIS — E7800 Pure hypercholesterolemia, unspecified: Secondary | ICD-10-CM | POA: Diagnosis not present

## 2019-03-01 DIAGNOSIS — F329 Major depressive disorder, single episode, unspecified: Secondary | ICD-10-CM | POA: Diagnosis not present

## 2019-03-01 DIAGNOSIS — I10 Essential (primary) hypertension: Secondary | ICD-10-CM | POA: Diagnosis not present

## 2019-03-01 DIAGNOSIS — K219 Gastro-esophageal reflux disease without esophagitis: Secondary | ICD-10-CM | POA: Diagnosis not present

## 2019-03-01 DIAGNOSIS — Z7951 Long term (current) use of inhaled steroids: Secondary | ICD-10-CM | POA: Diagnosis not present

## 2019-03-01 DIAGNOSIS — M19012 Primary osteoarthritis, left shoulder: Secondary | ICD-10-CM | POA: Diagnosis not present

## 2019-03-01 DIAGNOSIS — J449 Chronic obstructive pulmonary disease, unspecified: Secondary | ICD-10-CM | POA: Diagnosis not present

## 2019-03-01 DIAGNOSIS — Z87891 Personal history of nicotine dependence: Secondary | ICD-10-CM | POA: Diagnosis not present

## 2019-03-01 DIAGNOSIS — Z79899 Other long term (current) drug therapy: Secondary | ICD-10-CM | POA: Diagnosis not present

## 2019-03-01 DIAGNOSIS — G4730 Sleep apnea, unspecified: Secondary | ICD-10-CM | POA: Diagnosis not present

## 2019-03-01 DIAGNOSIS — M25711 Osteophyte, right shoulder: Secondary | ICD-10-CM | POA: Diagnosis not present

## 2019-03-01 MED ORDER — OXYCODONE-ACETAMINOPHEN 5-325 MG PO TABS: 1.0000 | ORAL_TABLET | ORAL | 0 refills | Status: DC | PRN

## 2019-03-01 MED ORDER — CYCLOBENZAPRINE HCL 10 MG PO TABS: 10.0000 mg | ORAL_TABLET | Freq: Three times a day (TID) | ORAL | 1 refills | Status: DC | PRN

## 2019-03-01 NOTE — Discharge Summary (Signed)
PATIENT ID:      Ryan [name]  MRN:     [id number] DOB/AGE:    [birth year] / 63 y.o.     DISCHARGE SUMMARY  ADMISSION DATE:    02/28/2019 DISCHARGE DATE:    ADMISSION DIAGNOSIS: left shoulder osteoarthritis Past Medical History:  Diagnosis Date  . Anemia   . Arthritis   . Asthma   . COPD (chronic obstructive pulmonary disease) ([place])   . Depression   . GERD (gastroesophageal reflux disease)   . High cholesterol    diet controlled  . Hypertension    diet controlled  . Sleep apnea    CPAP    DISCHARGE DIAGNOSIS:   Active Problems:   Status post total shoulder arthroplasty, left   PROCEDURE: Procedure(s): TOTAL SHOULDER ARTHROPLASTY on 02/28/2019  CONSULTS:    HISTORY:  See H&P in chart.  HOSPITAL COURSE:  Ryan [name] is a 63 y.o. admitted on 02/28/2019 with a diagnosis of left shoulder osteoarthritis.  They were brought to the operating room on 02/28/2019 and underwent Procedure(s): TOTAL SHOULDER ARTHROPLASTY.    They were given perioperative antibiotics:  Anti-infectives (From admission, onward)   Start     Dose/Rate Route Frequency Ordered Stop   02/28/19 0815  ceFAZolin (ANCEF) IVPB 2g/100 mL premix     2 g 200 mL/hr over 30 Minutes Intravenous On call to O.R. 02/28/19 [id number] 02/28/19 1028    .  Patient underwent the above named procedure and tolerated it well. The following day they were hemodynamically stable and pain was controlled on oral analgesics. They were neurovascularly intact to the operative extremity. OT was ordered and worked with patient per protocol. They were medically and orthopaedically stable for discharge on .    DIAGNOSTIC STUDIES:  RECENT RADIOGRAPHIC STUDIES :  Ct Shoulder Left Wo Contrast  Result Date: 02/21/2019 CLINICAL DATA:  Preop, limited range of motion EXAM: CT OF THE UPPER LEFT EXTREMITY WITHOUT CONTRAST TECHNIQUE: Multidetector CT imaging of the upper left extremity was performed according to the standard protocol. COMPARISON:   None. FINDINGS: Bones/Joint/Cartilage There is advanced glenohumeral joint osteoarthritis with joint space loss and inferior marginal osteophyte formation. There is a high riding humeral head with flattening of the humeral head surface. There is moderate AC joint arthrosis with joint space loss. There is diffuse osteopenia. No fracture is seen. Ligaments Suboptimally assessed by CT. Muscles and Tendons There appears to be attenuated of the infraspinatus tendon at the anterior insertion site as well as the subscapularis tendon, likely chronic tears. No fatty atrophy is noted within the rotator cuff musculature. The remainder of the muscles surrounding shoulder are intact. Soft tissues There is centrilobular emphysematous changes seen within the lung apex. No large glenohumeral joint effusion. No axillary adenopathy. IMPRESSION: Advanced glenohumeral joint osteoarthritis with slight flattening of the humeral head and a high riding humeral head. Chronic partial tears of the subscapularis and infraspinatus tendons at the insertion site. Electronically Signed   By: Prudencio Pair M.D.   On: 02/21/2019 23:51    RECENT VITAL SIGNS:   Patient Vitals for the past 24 hrs:  BP Temp Temp src Pulse Resp SpO2  03/01/19 0709 - - - - - 92 %  03/01/19 0522 124/79 97.7 F (36.5 C) Oral 65 18 100 %  03/01/19 0137 115/71 98 F (36.7 C) - (!) 57 - 99 %  02/28/19 2224 - - - 69 18 96 %  02/28/19 2147 112/67 97.9 F (36.6 C) Oral 67 18 95 %  02/28/19 2145 112/67 97.9 F (36.6 C) Oral 67 - 95 %  02/28/19 2044 - - - - - 93 %  02/28/19 1710 117/75 - Oral 84 16 96 %  02/28/19 1557 119/81 98.3 F (36.8 C) Oral 81 16 97 %  02/28/19 1444 122/78 97.9 F (36.6 C) Oral 82 16 95 %  02/28/19 1336 128/81 97.9 F (36.6 C) Oral 87 - 94 %  02/28/19 1315 116/82 98 F (36.7 C) - 84 (!) 9 93 %  02/28/19 1300 124/81 - - 95 10 96 %  02/28/19 1245 135/84 - - 96 (!) 9 91 %  02/28/19 1230 (!) 146/86 - - 94 20 99 %  02/28/19 1215 140/89  - - 96 14 99 %  02/28/19 1212 (!) 152/87 97.8 F (36.6 C) - (!) 102 14 99 %  02/28/19 0938 116/74 - - 75 13 93 %  02/28/19 0928 114/72 - - 71 13 93 %  02/28/19 0923 121/76 - - 69 14 93 %  02/28/19 0918 120/86 - - 74 14 94 %  02/28/19 0913 120/85 - - 75 16 94 %  .  RECENT EKG RESULTS:    Orders placed or performed during the hospital encounter of 02/25/19  . EKG 12 lead  . EKG 12 lead    DISCHARGE INSTRUCTIONS:    DISCHARGE MEDICATIONS:   Allergies as of 03/01/2019      Reactions   Latex Other (See Comments)   Skin redness   Tiotropium Bromide Monohydrate Other (See Comments)   Patient reports blurred vision after taking Spiriva      Medication List    TAKE these medications   acetaminophen 500 MG tablet Commonly known as: TYLENOL Take 500 mg by mouth 3 (three) times daily.   CoQ10 100 MG Caps Take 100 mg by mouth daily.   cyclobenzaprine 10 MG tablet Commonly known as: FLEXERIL Take 1 tablet (10 mg total) by mouth 3 (three) times daily as needed for muscle spasms.   fluticasone-salmeterol 115-21 MCG/ACT inhaler Commonly known as: ADVAIR HFA Inhale 2 puffs into the lungs 2 (two) times daily.   gabapentin 100 MG capsule Commonly known as: NEURONTIN Take 100 mg by mouth 3 (three) times daily.   lansoprazole 15 MG capsule Commonly known as: PREVACID Take 15 mg by mouth every morning.   mirtazapine 15 MG tablet Commonly known as: REMERON Take 15 mg by mouth at bedtime.   MUCINEX DM PO Take by mouth daily.   multivitamin with minerals Tabs tablet Take 1 tablet by mouth daily. Centrum Silver for 50+   oxyCODONE-acetaminophen 5-325 MG tablet Commonly known as: Percocet Take 1 tablet by mouth every 4 (four) hours as needed (max 6 q).   pravastatin 20 MG tablet Commonly known as: PRAVACHOL Take 20 mg by mouth every evening.   traMADol 50 MG tablet Commonly known as: ULTRAM Take 50 mg by mouth 3 (three) times daily.   triamcinolone 55 MCG/ACT Aero  nasal inhaler Commonly known as: NASACORT Place 2 sprays into the nose as needed.   valsartan 80 MG tablet Commonly known as: DIOVAN Take 80 mg by mouth daily.   Xopenex HFA 45 MCG/ACT inhaler Generic drug: levalbuterol Inhale 1 puff into the lungs 2 (two) times daily as needed for wheezing or shortness of breath.   zolpidem 10 MG tablet Commonly known as: AMBIEN Take 10 mg by mouth at bedtime as needed for sleep.       FOLLOW UP VISIT:   Follow-up  Information    Justice Britain, MD.   Specialty: Orthopedic Surgery Why: call to be seen in 10-14 days Contact information: [address] [place] [address] [id number]           DISCHARGE TO: Home   DISCHARGE CONDITION:  Thereasa Parkin Haydn Cush for Dr. Justice Britain 03/01/2019, 9:04 AM

## 2019-03-01 NOTE — TOC Transition Note (Signed)
Transition of Care [facility]) - CM/SW Discharge Note   Patient Details  Name: Ryan [name] MRN: [id number] Date of Birth: 01-07-1956  Transition of Care [facility]) CM/SW Contact:  Leeroy Cha, RN Phone Number: 03/01/2019, 10:36 AM   Clinical Narrative:    dcd to home   Final next level of care: Home/Self Care Barriers to Discharge: No Barriers Identified   Patient Goals and CMS Choice        Discharge Placement                       Discharge Plan and Services                                     Social Determinants of Health (SDOH) Interventions     Readmission Risk Interventions No flowsheet data found.

## 2019-03-01 NOTE — Plan of Care (Signed)
  Problem: Education: Goal: Knowledge of General Education information will improve Description: Including pain rating scale, medication(s)/side effects and non-pharmacologic comfort measures Outcome: Progressing   Problem: Health Behavior/Discharge Planning: Goal: Ability to manage health-related needs will improve Outcome: Progressing   Problem: Clinical Measurements: Goal: Ability to maintain clinical measurements within normal limits will improve Outcome: Progressing   Problem: Clinical Measurements: Goal: Will remain free from infection Outcome: Progressing   Problem: Clinical Measurements: Goal: Respiratory complications will improve Outcome: Progressing   

## 2019-03-01 NOTE — Evaluation (Signed)
Occupational Therapy Evaluation Patient Details Name: Ryan [name] MRN: [id number] DOB: 11-Dec-1955 Today's Date: 03/01/2019    History of Present Illness  s/p total shoulder arthroplasty, left   Clinical Impression   Pt admitted with the above diagnoses and presents with below problem list. PTA pt was independent with ADLs. Pt presents with expected post op deficits. Currently setup to min A with UB/LB ADLs, supervision for functional mobility and transfers. Pt awaiting d/c home. No further acute OT needs indicated at this time. OT signing off.      Follow Up Recommendations  Follow surgeon's recommendation for DC plan and follow-up therapies    Equipment Recommendations  None recommended by OT    Recommendations for Other Services       Precautions / Restrictions Precautions Precautions: Shoulder Shoulder Interventions: Shoulder sling/immobilizer;At all times;Off for dressing/bathing/exercises(off in controlled environment) Precaution Booklet Issued: Yes (comment) Required Braces or Orthoses: Sling Restrictions Weight Bearing Restrictions: Yes LUE Weight Bearing: Non weight bearing      Mobility Bed Mobility Overal bed mobility: Modified Independent                Transfers Overall transfer level: Needs assistance Equipment used: None Transfers: Sit to/from Stand Sit to Stand: Supervision         General transfer comment: to/from EOB and recliner    Balance Overall balance assessment: Mild deficits observed, not formally tested                                         ADL either performed or assessed with clinical judgement   ADL Overall ADL's : Needs assistance/impaired Eating/Feeding: Set up;Sitting   Grooming: Set up;Min guard;Sitting;Standing   Upper Body Bathing: Minimal assistance;Sitting   Lower Body Bathing: Min guard;Minimal assistance;Sit to/from stand   Upper Body Dressing : Minimal assistance;Sitting   Lower Body  Dressing: Min guard;Minimal assistance;Sit to/from stand   Toilet Transfer: Supervision/safety   Toileting- Water quality scientist and Hygiene: Supervision/safety;Min guard   Tub/ Banker: Walk-in shower;Supervision/safety   Functional mobility during ADLs: Supervision/safety General ADL Comments: Pt completed UB/LB dressing, walked in room. ADL and shoulder education completed     Vision         Perception     Praxis      Pertinent Vitals/Pain Pain Assessment: Faces Faces Pain Scale: Hurts a little bit Pain Location: L shoulder Pain Descriptors / Indicators: (occasional "pop") Pain Intervention(s): Monitored during session;Limited activity within patient's tolerance     Hand Dominance Right   Extremity/Trunk Assessment Upper Extremity Assessment Upper Extremity Assessment: LUE deficits/detail LUE Deficits / Details:  Status post total shoulder arthroplasty, left. Nerve block still largely intact.    Lower Extremity Assessment Lower Extremity Assessment: Overall WFL for tasks assessed   Cervical / Trunk Assessment Cervical / Trunk Assessment: Normal   Communication Communication Communication: No difficulties   Cognition Arousal/Alertness: Awake/alert Behavior During Therapy: WFL for tasks assessed/performed Overall Cognitive Status: Within Functional Limits for tasks assessed                                     General Comments  Pt reporting occasional "pop" sensation on posterior L shoulder. Advised to make sure he is not actively moving shoulder and to call MD office if this continues or worsens. Pt awaiting d/c  home.     Exercises Exercises: Other exercises Other Exercises Other Exercises: hand and some wrist ROM otherwise pt unable to provide return demo d/t nerve block   Shoulder Instructions      Home Living Family/patient expects to be discharged to:: Private residence Living Arrangements: Spouse/significant other Available  Help at Discharge: Family;Other (Comment)(initial 24/7 then spouse returns to work) Type of Home: House Home Access: Level entry     [place]: Two level;Able to live on main level with bedroom/bathroom     Bathroom Shower/Tub: Occupational psychologist: Standard     Home Equipment: Shower seat          Prior Functioning/Environment Level of Independence: Independent                 OT Problem List: Impaired balance (sitting and/or standing);Decreased knowledge of use of DME or AE;Decreased knowledge of precautions;Pain;Impaired UE functional use      OT Treatment/Interventions:      OT Goals(Current goals can be found in the care plan section) Acute Rehab OT Goals Patient Stated Goal: home today  OT Frequency:     Barriers to D/C:            Co-evaluation              AM-PAC OT "6 Clicks" Daily Activity     Outcome Measure Help from another person eating meals?: None Help from another person taking care of personal grooming?: A Little Help from another person toileting, which includes using toliet, bedpan, or urinal?: A Little Help from another person bathing (including washing, rinsing, drying)?: A Little Help from another person to put on and taking off regular upper body clothing?: A Little Help from another person to put on and taking off regular lower body clothing?: A Little 6 Click Score: 19   End of Session Equipment Utilized During Treatment: Other (comment)(sling)  Activity Tolerance: Patient tolerated treatment well Patient left: in chair;with call bell/phone within reach  OT Visit Diagnosis: Pain;Unsteadiness on feet (R26.81)                Time: [id number] OT Time Calculation (min): 32 min Charges:  OT General Charges $OT Visit: 1 Visit OT Evaluation $OT Eval Low Complexity: 1 Low OT Treatments $Self Care/Home Management : 8-22 mins  Tyrone Schimke, OT Acute Rehabilitation Services Pager: [phone] Office:  [phone]   Hortencia Pilar 03/01/2019, 10:54 AM

## 2019-03-13 DIAGNOSIS — Z471 Aftercare following joint replacement surgery: Secondary | ICD-10-CM | POA: Diagnosis not present

## 2019-03-13 DIAGNOSIS — Z96612 Presence of left artificial shoulder joint: Secondary | ICD-10-CM | POA: Diagnosis not present

## 2019-03-27 DIAGNOSIS — M25612 Stiffness of left shoulder, not elsewhere classified: Secondary | ICD-10-CM | POA: Diagnosis not present

## 2019-04-03 DIAGNOSIS — M25619 Stiffness of unspecified shoulder, not elsewhere classified: Secondary | ICD-10-CM | POA: Diagnosis not present

## 2019-04-04 DIAGNOSIS — G4733 Obstructive sleep apnea (adult) (pediatric): Secondary | ICD-10-CM | POA: Diagnosis not present

## 2019-04-11 DIAGNOSIS — M25612 Stiffness of left shoulder, not elsewhere classified: Secondary | ICD-10-CM | POA: Diagnosis not present

## 2019-04-11 DIAGNOSIS — I10 Essential (primary) hypertension: Secondary | ICD-10-CM | POA: Diagnosis not present

## 2019-04-11 DIAGNOSIS — G4700 Insomnia, unspecified: Secondary | ICD-10-CM | POA: Diagnosis not present

## 2019-04-17 DIAGNOSIS — M25612 Stiffness of left shoulder, not elsewhere classified: Secondary | ICD-10-CM | POA: Diagnosis not present

## 2019-04-19 DIAGNOSIS — M25612 Stiffness of left shoulder, not elsewhere classified: Secondary | ICD-10-CM | POA: Diagnosis not present

## 2019-04-24 DIAGNOSIS — M25619 Stiffness of unspecified shoulder, not elsewhere classified: Secondary | ICD-10-CM | POA: Diagnosis not present

## 2019-04-26 DIAGNOSIS — M25619 Stiffness of unspecified shoulder, not elsewhere classified: Secondary | ICD-10-CM | POA: Diagnosis not present

## 2019-05-01 DIAGNOSIS — M25619 Stiffness of unspecified shoulder, not elsewhere classified: Secondary | ICD-10-CM | POA: Diagnosis not present

## 2019-05-03 DIAGNOSIS — M25619 Stiffness of unspecified shoulder, not elsewhere classified: Secondary | ICD-10-CM | POA: Diagnosis not present

## 2019-05-07 DIAGNOSIS — M25612 Stiffness of left shoulder, not elsewhere classified: Secondary | ICD-10-CM | POA: Diagnosis not present

## 2019-05-10 DIAGNOSIS — M25612 Stiffness of left shoulder, not elsewhere classified: Secondary | ICD-10-CM | POA: Diagnosis not present

## 2019-05-10 DIAGNOSIS — Z23 Encounter for immunization: Secondary | ICD-10-CM | POA: Diagnosis not present

## 2019-05-14 DIAGNOSIS — M25612 Stiffness of left shoulder, not elsewhere classified: Secondary | ICD-10-CM | POA: Diagnosis not present

## 2019-05-17 DIAGNOSIS — M25619 Stiffness of unspecified shoulder, not elsewhere classified: Secondary | ICD-10-CM | POA: Diagnosis not present

## 2019-05-21 DIAGNOSIS — M25619 Stiffness of unspecified shoulder, not elsewhere classified: Secondary | ICD-10-CM | POA: Diagnosis not present

## 2019-05-24 DIAGNOSIS — M25619 Stiffness of unspecified shoulder, not elsewhere classified: Secondary | ICD-10-CM | POA: Diagnosis not present

## 2019-05-27 DIAGNOSIS — M25612 Stiffness of left shoulder, not elsewhere classified: Secondary | ICD-10-CM | POA: Diagnosis not present

## 2019-05-29 DIAGNOSIS — Z471 Aftercare following joint replacement surgery: Secondary | ICD-10-CM | POA: Diagnosis not present

## 2019-05-29 DIAGNOSIS — Z96612 Presence of left artificial shoulder joint: Secondary | ICD-10-CM | POA: Diagnosis not present

## 2019-06-05 DIAGNOSIS — M25612 Stiffness of left shoulder, not elsewhere classified: Secondary | ICD-10-CM | POA: Diagnosis not present

## 2019-06-11 DIAGNOSIS — M25619 Stiffness of unspecified shoulder, not elsewhere classified: Secondary | ICD-10-CM | POA: Diagnosis not present

## 2019-06-14 DIAGNOSIS — M25612 Stiffness of left shoulder, not elsewhere classified: Secondary | ICD-10-CM | POA: Diagnosis not present

## 2019-06-18 DIAGNOSIS — M25619 Stiffness of unspecified shoulder, not elsewhere classified: Secondary | ICD-10-CM | POA: Diagnosis not present

## 2019-06-20 DIAGNOSIS — M25619 Stiffness of unspecified shoulder, not elsewhere classified: Secondary | ICD-10-CM | POA: Diagnosis not present

## 2019-06-25 DIAGNOSIS — M25619 Stiffness of unspecified shoulder, not elsewhere classified: Secondary | ICD-10-CM | POA: Diagnosis not present

## 2019-06-27 DIAGNOSIS — M25619 Stiffness of unspecified shoulder, not elsewhere classified: Secondary | ICD-10-CM | POA: Diagnosis not present

## 2019-07-03 DIAGNOSIS — G4733 Obstructive sleep apnea (adult) (pediatric): Secondary | ICD-10-CM | POA: Diagnosis not present

## 2019-07-09 DIAGNOSIS — M25612 Stiffness of left shoulder, not elsewhere classified: Secondary | ICD-10-CM | POA: Diagnosis not present

## 2019-07-16 DIAGNOSIS — M25619 Stiffness of unspecified shoulder, not elsewhere classified: Secondary | ICD-10-CM | POA: Diagnosis not present

## 2019-07-19 DIAGNOSIS — M25619 Stiffness of unspecified shoulder, not elsewhere classified: Secondary | ICD-10-CM | POA: Diagnosis not present

## 2019-07-23 DIAGNOSIS — M25619 Stiffness of unspecified shoulder, not elsewhere classified: Secondary | ICD-10-CM | POA: Diagnosis not present

## 2019-07-26 DIAGNOSIS — M25612 Stiffness of left shoulder, not elsewhere classified: Secondary | ICD-10-CM | POA: Diagnosis not present

## 2019-07-30 DIAGNOSIS — M25619 Stiffness of unspecified shoulder, not elsewhere classified: Secondary | ICD-10-CM | POA: Diagnosis not present

## 2019-08-02 DIAGNOSIS — M25619 Stiffness of unspecified shoulder, not elsewhere classified: Secondary | ICD-10-CM | POA: Diagnosis not present

## 2019-08-06 DIAGNOSIS — M25619 Stiffness of unspecified shoulder, not elsewhere classified: Secondary | ICD-10-CM | POA: Diagnosis not present

## 2019-08-09 DIAGNOSIS — M25612 Stiffness of left shoulder, not elsewhere classified: Secondary | ICD-10-CM | POA: Diagnosis not present

## 2019-08-12 DIAGNOSIS — M25512 Pain in left shoulder: Secondary | ICD-10-CM | POA: Diagnosis not present

## 2019-08-12 DIAGNOSIS — G4701 Insomnia due to medical condition: Secondary | ICD-10-CM | POA: Diagnosis not present

## 2019-08-12 DIAGNOSIS — M25619 Stiffness of unspecified shoulder, not elsewhere classified: Secondary | ICD-10-CM | POA: Diagnosis not present

## 2019-08-12 DIAGNOSIS — I10 Essential (primary) hypertension: Secondary | ICD-10-CM | POA: Diagnosis not present

## 2019-08-15 DIAGNOSIS — M25619 Stiffness of unspecified shoulder, not elsewhere classified: Secondary | ICD-10-CM | POA: Diagnosis not present

## 2019-08-19 DIAGNOSIS — M25619 Stiffness of unspecified shoulder, not elsewhere classified: Secondary | ICD-10-CM | POA: Diagnosis not present

## 2019-08-22 DIAGNOSIS — M25619 Stiffness of unspecified shoulder, not elsewhere classified: Secondary | ICD-10-CM | POA: Diagnosis not present

## 2019-08-27 DIAGNOSIS — M25619 Stiffness of unspecified shoulder, not elsewhere classified: Secondary | ICD-10-CM | POA: Diagnosis not present

## 2019-08-30 DIAGNOSIS — M25612 Stiffness of left shoulder, not elsewhere classified: Secondary | ICD-10-CM | POA: Diagnosis not present

## 2019-09-03 DIAGNOSIS — M25619 Stiffness of unspecified shoulder, not elsewhere classified: Secondary | ICD-10-CM | POA: Diagnosis not present

## 2019-09-06 DIAGNOSIS — M25612 Stiffness of left shoulder, not elsewhere classified: Secondary | ICD-10-CM | POA: Diagnosis not present

## 2019-09-09 DIAGNOSIS — Z471 Aftercare following joint replacement surgery: Secondary | ICD-10-CM | POA: Diagnosis not present

## 2019-09-09 DIAGNOSIS — Z96612 Presence of left artificial shoulder joint: Secondary | ICD-10-CM | POA: Diagnosis not present

## 2019-09-10 DIAGNOSIS — M25619 Stiffness of unspecified shoulder, not elsewhere classified: Secondary | ICD-10-CM | POA: Diagnosis not present

## 2019-09-12 DIAGNOSIS — M25619 Stiffness of unspecified shoulder, not elsewhere classified: Secondary | ICD-10-CM | POA: Diagnosis not present

## 2019-09-17 DIAGNOSIS — M25619 Stiffness of unspecified shoulder, not elsewhere classified: Secondary | ICD-10-CM | POA: Diagnosis not present

## 2019-09-19 DIAGNOSIS — M25619 Stiffness of unspecified shoulder, not elsewhere classified: Secondary | ICD-10-CM | POA: Diagnosis not present

## 2019-09-24 DIAGNOSIS — M25619 Stiffness of unspecified shoulder, not elsewhere classified: Secondary | ICD-10-CM | POA: Diagnosis not present

## 2019-09-26 DIAGNOSIS — M25619 Stiffness of unspecified shoulder, not elsewhere classified: Secondary | ICD-10-CM | POA: Diagnosis not present

## 2019-10-01 DIAGNOSIS — G4733 Obstructive sleep apnea (adult) (pediatric): Secondary | ICD-10-CM | POA: Diagnosis not present

## 2019-10-01 DIAGNOSIS — M25612 Stiffness of left shoulder, not elsewhere classified: Secondary | ICD-10-CM | POA: Diagnosis not present

## 2019-10-09 DIAGNOSIS — Z96612 Presence of left artificial shoulder joint: Secondary | ICD-10-CM | POA: Diagnosis not present

## 2019-10-09 DIAGNOSIS — Z471 Aftercare following joint replacement surgery: Secondary | ICD-10-CM | POA: Diagnosis not present

## 2019-10-15 DIAGNOSIS — Z6835 Body mass index (BMI) 35.0-35.9, adult: Secondary | ICD-10-CM | POA: Diagnosis not present

## 2019-10-15 DIAGNOSIS — R0600 Dyspnea, unspecified: Secondary | ICD-10-CM | POA: Diagnosis not present

## 2019-10-15 DIAGNOSIS — E669 Obesity, unspecified: Secondary | ICD-10-CM | POA: Diagnosis not present

## 2019-10-15 DIAGNOSIS — J449 Chronic obstructive pulmonary disease, unspecified: Secondary | ICD-10-CM | POA: Diagnosis not present

## 2019-10-15 DIAGNOSIS — R0609 Other forms of dyspnea: Secondary | ICD-10-CM | POA: Diagnosis not present

## 2019-10-15 DIAGNOSIS — Z9989 Dependence on other enabling machines and devices: Secondary | ICD-10-CM | POA: Diagnosis not present

## 2019-10-15 DIAGNOSIS — G4733 Obstructive sleep apnea (adult) (pediatric): Secondary | ICD-10-CM | POA: Diagnosis not present

## 2019-10-15 DIAGNOSIS — M25612 Stiffness of left shoulder, not elsewhere classified: Secondary | ICD-10-CM | POA: Diagnosis not present

## 2019-12-03 DIAGNOSIS — I10 Essential (primary) hypertension: Secondary | ICD-10-CM | POA: Diagnosis not present

## 2019-12-03 DIAGNOSIS — M1990 Unspecified osteoarthritis, unspecified site: Secondary | ICD-10-CM | POA: Diagnosis not present

## 2019-12-03 DIAGNOSIS — E785 Hyperlipidemia, unspecified: Secondary | ICD-10-CM | POA: Diagnosis not present

## 2019-12-03 DIAGNOSIS — Z79899 Other long term (current) drug therapy: Secondary | ICD-10-CM | POA: Diagnosis not present

## 2019-12-03 DIAGNOSIS — G4733 Obstructive sleep apnea (adult) (pediatric): Secondary | ICD-10-CM | POA: Diagnosis not present

## 2019-12-03 DIAGNOSIS — Z125 Encounter for screening for malignant neoplasm of prostate: Secondary | ICD-10-CM | POA: Diagnosis not present

## 2019-12-10 DIAGNOSIS — E785 Hyperlipidemia, unspecified: Secondary | ICD-10-CM | POA: Diagnosis not present

## 2019-12-10 DIAGNOSIS — I10 Essential (primary) hypertension: Secondary | ICD-10-CM | POA: Diagnosis not present

## 2019-12-10 DIAGNOSIS — Z6834 Body mass index (BMI) 34.0-34.9, adult: Secondary | ICD-10-CM | POA: Diagnosis not present

## 2020-01-07 DIAGNOSIS — H0102A Squamous blepharitis right eye, upper and lower eyelids: Secondary | ICD-10-CM | POA: Diagnosis not present

## 2020-01-07 DIAGNOSIS — H26493 Other secondary cataract, bilateral: Secondary | ICD-10-CM | POA: Diagnosis not present

## 2020-01-07 DIAGNOSIS — H0102B Squamous blepharitis left eye, upper and lower eyelids: Secondary | ICD-10-CM | POA: Diagnosis not present

## 2020-01-07 DIAGNOSIS — H43813 Vitreous degeneration, bilateral: Secondary | ICD-10-CM | POA: Diagnosis not present

## 2020-03-11 DIAGNOSIS — Z471 Aftercare following joint replacement surgery: Secondary | ICD-10-CM | POA: Diagnosis not present

## 2020-03-11 DIAGNOSIS — Z96612 Presence of left artificial shoulder joint: Secondary | ICD-10-CM | POA: Diagnosis not present

## 2020-03-12 DIAGNOSIS — J449 Chronic obstructive pulmonary disease, unspecified: Secondary | ICD-10-CM | POA: Diagnosis not present

## 2020-03-12 DIAGNOSIS — I10 Essential (primary) hypertension: Secondary | ICD-10-CM | POA: Diagnosis not present

## 2020-04-18 DIAGNOSIS — J449 Chronic obstructive pulmonary disease, unspecified: Secondary | ICD-10-CM | POA: Diagnosis not present

## 2020-04-18 DIAGNOSIS — I10 Essential (primary) hypertension: Secondary | ICD-10-CM | POA: Diagnosis not present

## 2020-04-18 DIAGNOSIS — K047 Periapical abscess without sinus: Secondary | ICD-10-CM | POA: Diagnosis not present

## 2020-04-18 DIAGNOSIS — Z96612 Presence of left artificial shoulder joint: Secondary | ICD-10-CM | POA: Insufficient documentation

## 2020-04-18 DIAGNOSIS — G459 Transient cerebral ischemic attack, unspecified: Secondary | ICD-10-CM | POA: Diagnosis not present

## 2020-04-18 DIAGNOSIS — Z87891 Personal history of nicotine dependence: Secondary | ICD-10-CM | POA: Insufficient documentation

## 2020-04-18 DIAGNOSIS — J45909 Unspecified asthma, uncomplicated: Secondary | ICD-10-CM | POA: Insufficient documentation

## 2020-04-18 DIAGNOSIS — K0889 Other specified disorders of teeth and supporting structures: Secondary | ICD-10-CM | POA: Diagnosis not present

## 2020-04-18 DIAGNOSIS — R52 Pain, unspecified: Secondary | ICD-10-CM | POA: Diagnosis not present

## 2020-04-18 DIAGNOSIS — R609 Edema, unspecified: Secondary | ICD-10-CM | POA: Diagnosis not present

## 2020-04-19 ENCOUNTER — Other Ambulatory Visit: Payer: Self-pay

## 2020-04-19 ENCOUNTER — Emergency Department (HOSPITAL_COMMUNITY)
Admission: EM | Admit: 2020-04-19 | Discharge: 2020-04-19 | Disposition: A | Discharge status: Home / Self Care | Payer: BC Managed Care – PPO | Attending: Emergency Medicine | Admitting: Emergency Medicine

## 2020-04-19 DIAGNOSIS — K047 Periapical abscess without sinus: Secondary | ICD-10-CM

## 2020-04-19 MED ORDER — AMOXICILLIN 500 MG PO CAPS: 500.0000 mg | ORAL_CAPSULE | Freq: Two times a day (BID) | ORAL | 0 refills | Status: DC

## 2020-04-19 NOTE — ED Triage Notes (Signed)
Pt reports left lower dental pain x 5 days. Pt also reports hypertension tonight (163/106).

## 2020-04-19 NOTE — ED Provider Notes (Signed)
[facility] EMERGENCY DEPARTMENT Provider Note   CSN: [id number] Arrival date & time: 04/18/20  2317     History Chief Complaint  Patient presents with  . Dental Pain    Ryan [name] is a 64 y.o. male.  The history is provided by the patient.  Dental Pain Location:  Lower Quality:  Aching Severity:  Moderate Onset quality:  Gradual Duration:  5 days Timing:  Constant Progression:  Worsening Chronicity:  New Relieved by:  Nothing Worsened by:  Nothing Associated symptoms: no fever and no headaches   Patient reports he had left lower dental and jaw pain for the past several days. No fevers or vomiting. Tonight he felt the pain worsened and he checked his blood pressure & it was elevated. Soon after he noted that he had numbness to the tip of his left lower jaw and the pain improved. No headache or visual change. No arm or leg drift. He has been taking his blood pressure medicine as prescribed. He tried calling his dentist who was unavailable Patient did take an old amoxicillin he had at home for the presumed dental infection    Past Medical History:  Diagnosis Date  . Anemia   . Arthritis   . Asthma   . COPD (chronic obstructive pulmonary disease) ([place])   . Depression   . GERD (gastroesophageal reflux disease)   . High cholesterol    diet controlled  . Hypertension    diet controlled  . Sleep apnea    CPAP    Patient Active Problem List   Diagnosis Date Noted  . Status post total shoulder arthroplasty, left 02/28/2019  . Mucosal abnormality of stomach   . AVM (arteriovenous malformation)   . History of colonic polyps   . Diverticulosis of colon without hemorrhage   . IDA (iron deficiency anemia) 08/08/2014  . HYPERTENSION 01/04/2009  . ALLERGIC RHINITIS 01/04/2009  . OBSTRUCTIVE SLEEP APNEA 01/01/2009    Past Surgical History:  Procedure Laterality Date  . CATARACT EXTRACTION  2018  . CATARACT EXTRACTION W/PHACO Right 02/23/2015   Procedure: CATARACT  EXTRACTION PHACO AND INTRAOCULAR LENS PLACEMENT (IOC);  Surgeon: Williams Che, MD;  Location: [facility] ORS;  Service: Ophthalmology;  Laterality: Right;  CDE:5.02  . CHOLECYSTECTOMY    . COLONOSCOPY  03/15/2005   Dr. Rourk:Rectal polyps (diminutive), status post cold biopsy/removal/normal colon. hyperplastic  . COLONOSCOPY N/A 08/27/2014   Dr. Gala Romney: Multiple rectal and colonic polyps removed, one which was a 1 cm sessile polyp. Cecal AVMS ablated as described above. colonic Diverticulosis. Path with rectal hyperplastic polyp, ascending colon polyps with benign colonic mucosa, sessile serrated polyp with associated lipoma. Recommendation for surveillance colonoscopy in 3 years.   . COLONOSCOPY WITH PROPOFOL N/A 12/25/2017   Procedure: COLONOSCOPY WITH PROPOFOL;  Surgeon: Daneil Dolin, MD;  Location: [facility] SUITE;  Service: Endoscopy;  Laterality: N/A;  1:45pm  . ESOPHAGOGASTRODUODENOSCOPY N/A 08/27/2014   Dr. Rourk:Tiny distal esophageal erosions consistent with mild erosive reflux esophaigitis. Hiatal hernia. Antral erosions status post gastric biopsy. mild chronic gastritis, negative H.pylori  . GIVENS CAPSULE STUDY N/A 11/05/2014   Procedure: GIVENS CAPSULE STUDY;  Surgeon: Daneil Dolin, MD;  Location: [facility];  Service: Endoscopy;  Laterality: N/A;  0800  . KNEE ARTHROSCOPY Right   . POLYPECTOMY  12/25/2017   Procedure: POLYPECTOMY;  Surgeon: Daneil Dolin, MD;  Location: [facility];  Service: Endoscopy;;  . TOTAL SHOULDER ARTHROPLASTY Left 02/28/2019   Procedure: TOTAL SHOULDER  ARTHROPLASTY;  Surgeon: Justice Britain, MD;  Location: [facility];  Service: Orthopedics;  Laterality: Left;  128min       Family History  Problem Relation Age of Onset  . Cancer Mother        unsure primary  . Colon cancer Neg Hx     Social History   Tobacco Use  . Smoking status: Former Smoker    Packs/day: 2.00    Years: 35.00    Pack years: 70.00    Types: Cigarettes    Quit date: 10/15/2008     Years since quitting: 11.5  . Smokeless tobacco: Never Used  Vaping Use  . Vaping Use: Never used  Substance Use Topics  . Alcohol use: Yes    Alcohol/week: 2.0 standard drinks    Types: 2 Shots of liquor per week    Comment: couple of drinks of b[place] every night  . Drug use: No    Home Medications Prior to Admission medications   Medication Sig Start Date End Date Taking? Authorizing Provider  acetaminophen (TYLENOL) 500 MG tablet Take 500 mg by mouth 3 (three) times daily.     [provider]  amoxicillin (AMOXIL) 500 MG capsule Take 1 capsule (500 mg total) by mouth 2 (two) times daily. 04/19/20   Ripley Fraise, MD  Coenzyme Q10 (COQ10) 100 MG CAPS Take 100 mg by mouth daily.     [provider]  Dextromethorphan-guaiFENesin [facility] DM PO) Take by mouth daily.    [provider]  fluticasone-salmeterol (ADVAIR HFA) 115-21 MCG/ACT inhaler Inhale 2 puffs into the lungs 2 (two) times daily.  09/14/10   [provider]  gabapentin (NEURONTIN) 100 MG capsule Take 100 mg by mouth 3 (three) times daily.  03/09/10   [provider]  lansoprazole (PREVACID) 15 MG capsule Take 15 mg by mouth every morning.     [provider]  levalbuterol (XOPENEX HFA) 45 MCG/ACT inhaler Inhale 1 puff into the lungs 2 (two) times daily as needed for wheezing or shortness of breath.     [provider]  mirtazapine (REMERON) 15 MG tablet Take 15 mg by mouth at bedtime.     [provider]  Multiple Vitamin (MULTIVITAMIN WITH MINERALS) TABS tablet Take 1 tablet by mouth daily. Centrum Silver for 50+    [provider]  pravastatin (PRAVACHOL) 20 MG tablet Take 20 mg by mouth every evening.    [provider]  traMADol (ULTRAM) 50 MG tablet Take 50 mg by mouth 3 (three) times daily.  03/12/10   [provider]  triamcinolone (NASACORT) 55 MCG/ACT AERO nasal inhaler Place 2 sprays into the nose as needed.    [provider]  valsartan (DIOVAN) 80 MG tablet Take 80 mg by mouth daily.    [provider]  zolpidem (AMBIEN) 10 MG tablet Take 10 mg by mouth at bedtime as needed for sleep.  03/09/10   [provider]    Allergies    Latex and Tiotropium bromide monohydrate  Review of Systems   Review of Systems  Constitutional: Positive for fatigue. Negative for fever.  HENT: Positive for dental problem.   Eyes: Negative for visual disturbance.  Cardiovascular: Negative for chest pain.  Gastrointestinal: Negative for vomiting.  Neurological: Positive for numbness. Negative for dizziness, speech difficulty, weakness and headaches.       Numbness to left lower jaw   All other systems reviewed and are negative.   Physical Exam Updated Vital  Signs BP (!) 159/95 (BP Location: Right Arm)   Pulse 78   Temp 98.5 F (36.9 C) (Oral)   Resp 18   Ht 1.778 m (5\' 10" )   Wt 108.9 kg   SpO2 97%   BMI 34.44 kg/m   Physical Exam CONSTITUTIONAL: Well developed/well nourished HEAD: Normocephalic/atraumatic EYES: EOMI/PERRL ENMT: Mucous membranes moist, gingival abscess noted to left lower molar. There is active pus draining. No crepitus. Floor mouth soft. No drooling, no trismus. No significant facial swelling is noted NECK: supple no meningeal signs CV: S1/S2 noted, no murmurs/rubs/gallops noted LUNGS: Lungs are clear to auscultation bilaterally, no apparent distress ABDOMEN: soft, nontender, no rebound or guarding, bowel sounds noted throughout abdomen GU:no cva tenderness NEURO: Pt is awake/alert/appropriate, moves all extremitiesx4.  No facial droop. No arm or leg drift. Sensory deficit to the extremities. EXTREMITIES: pulses normal/equal, full ROM SKIN: warm, color normal PSYCH: no abnormalities of mood noted, alert and oriented to situation  ED Results / Procedures / Treatments   Labs (all labs ordered are listed, but only abnormal results are displayed) Labs Reviewed - No  data to display  EKG None  Radiology No results found.  Procedures Procedures   Medications Ordered in ED Medications - No data to display  ED Course  I have reviewed the triage vital signs and the nursing notes.      MDM Rules/Calculators/A&P                          Patient presents because he had dental pain and noted elevated blood pressure. Reports he had numbness at the tip of his left lower jaw. This is likely related to his dental infection. He has no other signs of acute neurologic emergency. Suspect blood pressure is elevated due to dental pain. Prescription for amoxicillin provided, he will follow-up with dentistry Final Clinical Impression(s) / ED Diagnoses Final diagnoses:  Dental abscess    Rx / DC Orders ED Discharge Orders         Ordered    amoxicillin (AMOXIL) 500 MG capsule  2 times daily        04/19/20 4431           Ripley Fraise, MD 04/19/20 [phone]

## 2020-04-21 DIAGNOSIS — G4733 Obstructive sleep apnea (adult) (pediatric): Secondary | ICD-10-CM | POA: Diagnosis not present

## 2020-05-14 DIAGNOSIS — Z23 Encounter for immunization: Secondary | ICD-10-CM | POA: Diagnosis not present

## 2020-05-15 DIAGNOSIS — N451 Epididymitis: Secondary | ICD-10-CM | POA: Diagnosis not present

## 2020-05-22 DIAGNOSIS — G4733 Obstructive sleep apnea (adult) (pediatric): Secondary | ICD-10-CM | POA: Diagnosis not present

## 2020-06-17 ENCOUNTER — Encounter: Payer: Self-pay | Admitting: Urology

## 2020-06-17 ENCOUNTER — Ambulatory Visit (INDEPENDENT_AMBULATORY_CARE_PROVIDER_SITE_OTHER): Payer: BC Managed Care – PPO | Admitting: Urology

## 2020-06-17 ENCOUNTER — Other Ambulatory Visit: Payer: Self-pay

## 2020-06-17 VITALS — BP 152/77 | HR 80 | Temp 98.5°F | Ht 70.0 in | Wt 240.0 lb

## 2020-06-17 DIAGNOSIS — K4090 Unilateral inguinal hernia, without obstruction or gangrene, not specified as recurrent: Secondary | ICD-10-CM

## 2020-06-17 DIAGNOSIS — N50812 Left testicular pain: Secondary | ICD-10-CM | POA: Diagnosis not present

## 2020-06-17 LAB — URINALYSIS, ROUTINE W REFLEX MICROSCOPIC
Bilirubin, UA: NEGATIVE
Glucose, UA: NEGATIVE
Ketones, UA: NEGATIVE
Leukocytes,UA: NEGATIVE
Nitrite, UA: NEGATIVE
Protein,UA: NEGATIVE
RBC, UA: NEGATIVE
Specific Gravity, UA: 1.025 (ref 1.005–1.030)
Urobilinogen, Ur: 0.2 mg/dL (ref 0.2–1.0)
pH, UA: 6 (ref 5.0–7.5)

## 2020-06-17 NOTE — Progress Notes (Signed)
Urological Symptom Review  Patient is experiencing the following symptoms: Erection problems (male only)   Review of Systems  Gastrointestinal (upper)  : Indigestion/heartburn  Gastrointestinal (lower) : Negative for lower GI symptoms  Constitutional : Negative for symptoms  Skin: Negative for skin symptoms  Eyes: Negative for eye symptoms  Ear/Nose/Throat : Negative for Ear/Nose/Throat symptoms  Hematologic/Lymphatic: Negative for Hematologic/Lymphatic symptoms  Cardiovascular : Negative for cardiovascular symptoms  Respiratory : Shortness of breath  Endocrine: Negative for endocrine symptoms  Musculoskeletal: Back pain Joint pain  Neurological: Negative for neurological symptoms  Psychologic: Negative for psychiatric symptoms

## 2020-06-17 NOTE — Progress Notes (Signed)
06/17/2020 4:21 PM   Ryan [name] October 25, 1955 [id number]  Referring provider: Asencion Noble, MD [address] [place],  [place] [address]  Left testicular pain  HPI: Ryan [name] is a 64yo here for evaluation of left testicular pain. Starting 11/4 he developed dull left testicular pain which was worse with movement. It is worse with walking and lifting. Pain decreases with rest. He denies any LUTS. He was prescribed levaquin and took 1 dose. The pain continues to be intermittent, dull and related to activity. NO other associated symptoms   PMH: Past Medical History:  Diagnosis Date  . Anemia   . Arthritis   . Asthma   . COPD (chronic obstructive pulmonary disease) ([place])   . Depression   . GERD (gastroesophageal reflux disease)   . High cholesterol    diet controlled  . Hypertension    diet controlled  . Sleep apnea    CPAP    Surgical History: Past Surgical History:  Procedure Laterality Date  . CATARACT EXTRACTION  2018  . CATARACT EXTRACTION W/PHACO Right 02/23/2015   Procedure: CATARACT EXTRACTION PHACO AND INTRAOCULAR LENS PLACEMENT (IOC);  Surgeon: Williams Che, MD;  Location: [facility];  Service: Ophthalmology;  Laterality: Right;  CDE:5.02  . CHOLECYSTECTOMY    . COLONOSCOPY  03/15/2005   Dr. Rourk:Rectal polyps (diminutive), status post cold biopsy/removal/normal colon. hyperplastic  . COLONOSCOPY N/A 08/27/2014   Dr. Gala Romney: Multiple rectal and colonic polyps removed, one which was a 1 cm sessile polyp. Cecal AVMS ablated as described above. colonic Diverticulosis. Path with rectal hyperplastic polyp, ascending colon polyps with benign colonic mucosa, sessile serrated polyp with associated lipoma. Recommendation for surveillance colonoscopy in 3 years.   . COLONOSCOPY WITH PROPOFOL N/A 12/25/2017   Procedure: COLONOSCOPY WITH PROPOFOL;  Surgeon: Daneil Dolin, MD;  [facility];  Service: Endoscopy;  Laterality: N/A;  1:45pm  .  ESOPHAGOGASTRODUODENOSCOPY N/A 08/27/2014   Dr. Rourk:Tiny distal esophageal erosions consistent with mild erosive reflux esophaigitis. Hiatal hernia. Antral erosions status post gastric biopsy. mild chronic gastritis, negative H.pylori  . GIVENS CAPSULE STUDY N/A 11/05/2014   Procedure: GIVENS CAPSULE STUDY;  Surgeon: Daneil Dolin, MD;  [facility];  Service: Endoscopy;  Laterality: N/A;  0800  . KNEE ARTHROSCOPY Right   . POLYPECTOMY  12/25/2017   Procedure: POLYPECTOMY;  Surgeon: Daneil Dolin, MD;  [facility]: [facility];  Service: Endoscopy;;  . TOTAL SHOULDER ARTHROPLASTY Left 02/28/2019   Procedure: TOTAL SHOULDER ARTHROPLASTY;  Surgeon: Justice Britain, MD;  Location: [facility];  Service: Orthopedics;  Laterality: Left;  123min    Home Medications:  Allergies as of 06/17/2020      Reactions   Latex Other (See Comments)   Skin redness   Tiotropium Bromide Monohydrate Other (See Comments)   Patient reports blurred vision after taking Spiriva      Medication List       Accurate as of June 17, 2020  4:21 PM. If you have any questions, ask your nurse or doctor.        acetaminophen 500 MG tablet Commonly known as: TYLENOL Take 500 mg by mouth 3 (three) times daily.   amoxicillin 500 MG capsule Commonly known as: AMOXIL Take 1 capsule (500 mg total) by mouth 2 (two) times daily.   CoQ10 100 MG Caps Take 100 mg by mouth daily.   fluticasone-salmeterol 115-21 MCG/ACT inhaler Commonly known as: ADVAIR HFA Inhale 2 puffs into the lungs 2 (two)  times daily.   gabapentin 100 MG capsule Commonly known as: NEURONTIN Take 100 mg by mouth 3 (three) times daily.   lansoprazole 15 MG capsule Commonly known as: PREVACID Take 15 mg by mouth every morning.   mirtazapine 15 MG tablet Commonly known as: REMERON Take 15 mg by mouth at bedtime.   MUCINEX DM PO Take by mouth daily.   multivitamin with minerals Tabs tablet Take 1 tablet by mouth daily. Centrum Silver  for 50+   pravastatin 20 MG tablet Commonly known as: PRAVACHOL Take 20 mg by mouth every evening.   traMADol 50 MG tablet Commonly known as: ULTRAM Take 50 mg by mouth 3 (three) times daily.   triamcinolone 55 MCG/ACT Aero nasal inhaler Commonly known as: NASACORT Place 2 sprays into the nose as needed.   valsartan 80 MG tablet Commonly known as: DIOVAN Take 80 mg by mouth daily.   valsartan 80 MG tablet Commonly known as: DIOVAN valsartan 80 mg tablet   160 mg every day by oral route.   valsartan 160 MG tablet Commonly known as: DIOVAN Take 160 mg by mouth daily.   Xopenex HFA 45 MCG/ACT inhaler Generic drug: levalbuterol Inhale 1 puff into the lungs 2 (two) times daily as needed for wheezing or shortness of breath.   zolpidem 10 MG tablet Commonly known as: AMBIEN Take 10 mg by mouth at bedtime as needed for sleep.       Allergies:  Allergies  Allergen Reactions  . Latex Other (See Comments)    Skin redness  . Tiotropium Bromide Monohydrate Other (See Comments)    Patient reports blurred vision after taking Spiriva    Family History: Family History  Problem Relation Age of Onset  . Cancer Mother        unsure primary  . Cancer Father   . Colon cancer Neg Hx     Social History:  reports that he quit smoking about 11 years ago. His smoking use included cigarettes. He has a 70.00 pack-year smoking history. He has never used smokeless tobacco. He reports current alcohol use of about 2.0 standard drinks of alcohol per week. He reports that he does not use drugs.  ROS: All other review of systems were reviewed and are negative except what is noted above in HPI  Physical Exam: BP (!) 152/77   Pulse 80   Temp 98.5 F (36.9 C)   Ht 5\' 10"  (1.778 m)   Wt 240 lb (108.9 kg)   BMI 34.44 kg/m   Constitutional:  Alert and oriented, No acute distress. HEENT: [place] AT, moist mucus membranes.  Trachea midline, no masses. Cardiovascular: No clubbing, cyanosis, or  edema. Respiratory: Normal respiratory effort, no increased work of breathing. GI: Abdomen is soft, nontender, nondistended, no abdominal masses GU: No CVA tenderness. Circumcised phallus. No masses/lesions on penis, testis, scrotum. Testis nontender. Small left inguinal hernia Lymph: No cervical or inguinal lymphadenopathy. Skin: No rashes, bruises or suspicious lesions. Neurologic: Grossly intact, no focal deficits, moving all 4 extremities. Psychiatric: Normal mood and affect.  Laboratory Data: Lab Results  Component Value Date   WBC 7.7 02/25/2019   HGB 14.6 02/25/2019   HCT 45.8 02/25/2019   MCV 97.9 02/25/2019   PLT 235 02/25/2019    Lab Results  Component Value Date   CREATININE 1.12 02/25/2019    No results found for: PSA  No results found for: TESTOSTERONE  No results found for: HGBA1C  Urinalysis No results found for: COLORURINE, APPEARANCEUR, [place],  PHURINE, GLUCOSEU, HGBUR, BILIRUBINUR, KETONESUR, PROTEINUR, UROBILINOGEN, NITRITE, LEUKOCYTESUR  No results found for: LABMICR, [place], RBCUA, LABEPIT, MUCUS, BACTERIA  Pertinent Imaging:  No results found for this or any previous visit.  No results found for this or any previous visit.  No results found for this or any previous visit.  No results found for this or any previous visit.  No results found for this or any previous visit.  No results found for this or any previous visit.  No results found for this or any previous visit.  No results found for this or any previous visit.   Assessment & Plan:    1. Pain in left testicle -likely related to left inguinal hernia. Referral to Dr. Arnoldo Morale - Urinalysis, Routine w reflex microscopic   No follow-ups on file.  Nicolette Bang, MD  [facility] [place]

## 2020-07-15 DIAGNOSIS — I10 Essential (primary) hypertension: Secondary | ICD-10-CM | POA: Diagnosis not present

## 2020-07-15 DIAGNOSIS — G2581 Restless legs syndrome: Secondary | ICD-10-CM | POA: Diagnosis not present

## 2020-07-17 ENCOUNTER — Ambulatory Visit: Payer: BC Managed Care – PPO | Admitting: Urology

## 2020-07-22 ENCOUNTER — Ambulatory Visit: Payer: BC Managed Care – PPO | Admitting: Urology

## 2020-07-24 DIAGNOSIS — G4733 Obstructive sleep apnea (adult) (pediatric): Secondary | ICD-10-CM | POA: Diagnosis not present

## 2020-08-04 ENCOUNTER — Encounter: Payer: Self-pay | Admitting: General Surgery

## 2020-08-04 ENCOUNTER — Ambulatory Visit: Payer: BC Managed Care – PPO | Admitting: General Surgery

## 2020-08-04 ENCOUNTER — Other Ambulatory Visit: Payer: Self-pay

## 2020-08-04 VITALS — BP 131/83 | HR 73 | Temp 97.8°F | Resp 14 | Ht 70.0 in | Wt 247.0 lb

## 2020-08-04 DIAGNOSIS — R1032 Left lower quadrant pain: Secondary | ICD-10-CM

## 2020-08-04 NOTE — Patient Instructions (Signed)
Inguinal Hernia, Adult An inguinal hernia develops when fat or the intestines push through a weak spot in a muscle where the leg meets the lower abdomen (groin). This creates a bulge. This kind of hernia could also be:  In the scrotum, if you are male.  In folds of skin around the vagina, if you are male. There are three types of inguinal hernias:  Hernias that can be pushed back into the abdomen (are reducible). This type rarely causes pain.  Hernias that are not reducible (are incarcerated).  Hernias that are not reducible and lose their blood supply (are strangulated). This type of hernia requires emergency surgery. What are the causes? This condition is caused by having a weak spot in the muscles or tissues in your groin. This develops over time. The hernia may poke through the weak spot when you suddenly strain your lower abdominal muscles, such as when you:  Lift a heavy object.  Strain to have a bowel movement. Constipation can lead to straining.  Cough. What increases the risk? This condition is more likely to develop in:  Males.  Pregnant females.  People who: ? Are overweight. ? Work in jobs that require long periods of standing or heavy lifting. ? Have had an inguinal hernia before. ? Smoke or have lung disease. These factors can lead to long-term (chronic) coughing. What are the signs or symptoms? Symptoms may depend on the size of the hernia. Often, a small inguinal hernia has no symptoms. Symptoms of a larger hernia may include:  A bulge in the groin area. This is easier to see when standing. It might not be visible when lying down.  Pain or burning in the groin. This may get worse when lifting, straining, or coughing.  A dull ache or a feeling of pressure in the groin.  An unusual bulge in the scrotum, in males. Symptoms of a strangulated inguinal hernia may include:  A bulge in your groin that is very painful and tender to the touch.  A bulge that  turns red or purple.  Fever, nausea, and vomiting.  Inability to have a bowel movement or to pass gas. How is this diagnosed? This condition is diagnosed based on your symptoms, your medical history, and a physical exam. Your health care provider may feel your groin area and ask you to cough. How is this treated? Treatment depends on the size of your hernia and whether you have symptoms. If you do not have symptoms, your health care provider may have you watch your hernia carefully and have you come in for follow-up visits. If your hernia is large or if you have symptoms, you may need surgery to repair the hernia. Follow these instructions at home: Lifestyle  Avoid lifting heavy objects.  Avoid standing for long periods of time.  Do not use any products that contain nicotine or tobacco. These products include cigarettes, chewing tobacco, and vaping devices, such as e-cigarettes. If you need help quitting, ask your health care provider.  Maintain a healthy weight. Preventing constipation You may need to take these actions to prevent or treat constipation:  Drink enough fluid to keep your urine pale yellow.  Take over-the-counter or prescription medicines.  Eat foods that are high in fiber, such as beans, whole grains, and fresh fruits and vegetables.  Limit foods that are high in fat and processed sugars, such as fried or sweet foods. General instructions  You may try to push the hernia back in place by very gently   pressing on it while lying down. Do not try to force the bulge back in if it will not push in easily.  Watch your hernia for any changes in shape, size, or color. Get help right away if you notice any changes.  Take over-the-counter and prescription medicines only as told by your health care provider.  Keep all follow-up visits. This is important. Contact a health care provider if:  You have a fever or chills.  You develop new symptoms.  Your symptoms get  worse. Get help right away if:  You have pain in your groin that suddenly gets worse.  You have a bulge in your groin that: ? Suddenly gets bigger and does not get smaller. ? Becomes red or purple or painful to the touch.  You are a man and you have a sudden pain in your scrotum, or the size of your scrotum suddenly changes.  You cannot push the hernia back in place by very gently pressing on it when you are lying down.  You have nausea or vomiting that does not go away.  You have a fast heartbeat.  You cannot have a bowel movement or pass gas. These symptoms may represent a serious problem that is an emergency. Do not wait to see if the symptoms will go away. Get medical help right away. Call your local emergency services (911 in the [place] Summary  An inguinal hernia develops when fat or the intestines push through a weak spot in a muscle where your leg meets your lower abdomen (groin).  This condition is caused by having a weak spot in muscles or tissues in your groin.  Symptoms may depend on the size of the hernia, and they may include pain or swelling in your groin. A small inguinal hernia often has no symptoms.  Treatment may not be needed if you do not have symptoms. If you have symptoms or a large hernia, you may need surgery to repair the hernia.  Avoid lifting heavy objects. Also, avoid standing for long periods of time. This information is not intended to replace advice given to you by your health care provider. Make sure you discuss any questions you have with your health care provider. Document Revised: 02/25/2020 Document Reviewed: 02/25/2020 Elsevier Patient Education  2021 Elsevier Inc.  

## 2020-08-05 NOTE — Progress Notes (Signed)
Ryan [name]; [id number]; Nov 18, 1955   HPI Patient is a 65 year old white male who was referred to my care by Dr. Asencion Noble for evaluation and treatment of left groin pain.  He was recently seen by Dr. Alyson Ingles of urology and felt that he may have a left inguinal hernia.  He was seen by Dr. Alyson Ingles for left testicular pain.  This occurred in December 2021.  Since that visit, he has not had any further left groin pain.  He went to be evaluated for a left inguinal hernia.  He has not noticed a mass.  He denies any pain in the left groin. Past Medical History:  Diagnosis Date  . Anemia   . Arthritis   . Asthma   . COPD (chronic obstructive pulmonary disease) ([place])   . Depression   . GERD (gastroesophageal reflux disease)   . High cholesterol    diet controlled  . Hypertension    diet controlled  . Sleep apnea    CPAP    Past Surgical History:  Procedure Laterality Date  . CATARACT EXTRACTION  2018  . CATARACT EXTRACTION W/PHACO Right 02/23/2015   Procedure: CATARACT EXTRACTION PHACO AND INTRAOCULAR LENS PLACEMENT (IOC);  Surgeon: Williams Che, MD;  Location: [facility];  Service: Ophthalmology;  Laterality: Right;  CDE:5.02  . CHOLECYSTECTOMY    . COLONOSCOPY  03/15/2005   Dr. Rourk:Rectal polyps (diminutive), status post cold biopsy/removal/normal colon. hyperplastic  . COLONOSCOPY N/A 08/27/2014   Dr. Gala Romney: Multiple rectal and colonic polyps removed, one which was a 1 cm sessile polyp. Cecal AVMS ablated as described above. colonic Diverticulosis. Path with rectal hyperplastic polyp, ascending colon polyps with benign colonic mucosa, sessile serrated polyp with associated lipoma. Recommendation for surveillance colonoscopy in 3 years.   . COLONOSCOPY WITH PROPOFOL N/A 12/25/2017   Procedure: COLONOSCOPY WITH PROPOFOL;  Surgeon: Daneil Dolin, MD;  [facility];  Service: Endoscopy;  Laterality: N/A;  1:45pm  . ESOPHAGOGASTRODUODENOSCOPY N/A 08/27/2014   Dr. Rourk:Tiny  distal esophageal erosions consistent with mild erosive reflux esophaigitis. Hiatal hernia. Antral erosions status post gastric biopsy. mild chronic gastritis, negative H.pylori  . GIVENS CAPSULE STUDY N/A 11/05/2014   Procedure: GIVENS CAPSULE STUDY;  Surgeon: Daneil Dolin, MD;  [facility];  Service: Endoscopy;  Laterality: N/A;  0800  . KNEE ARTHROSCOPY Right   . POLYPECTOMY  12/25/2017   Procedure: POLYPECTOMY;  Surgeon: Daneil Dolin, MD;  [facility];  Service: Endoscopy;;  . TOTAL SHOULDER ARTHROPLASTY Left 02/28/2019   Procedure: TOTAL SHOULDER ARTHROPLASTY;  Surgeon: Justice Britain, MD;  Location: [facility];  Service: Orthopedics;  Laterality: Left;  173min    Family History  Problem Relation Age of Onset  . Cancer Mother        unsure primary  . Cancer Father   . Colon cancer Neg Hx     Current Outpatient Medications on File Prior to Visit  Medication Sig Dispense Refill  . fluticasone-salmeterol (ADVAIR HFA) 115-21 MCG/ACT inhaler Inhale 2 puffs into the lungs 2 (two) times daily.     . mirtazapine (REMERON) 15 MG tablet Take 15 mg by mouth at bedtime.     . pravastatin (PRAVACHOL) 20 MG tablet Take 20 mg by mouth every evening.    Marland Kitchen rOPINIRole (REQUIP) 0.25 MG tablet Take 0.25 mg by mouth at bedtime.    . valsartan (DIOVAN) 160 MG tablet Take 160 mg by mouth daily.    Marland Kitchen zolpidem (AMBIEN) 10  MG tablet Take 10 mg by mouth at bedtime as needed for sleep.      No current facility-administered medications on file prior to visit.    Allergies  Allergen Reactions  . Latex Other (See Comments)    Skin redness  . Tiotropium Bromide Monohydrate Other (See Comments)    Patient reports blurred vision after taking Spiriva    Social History   Substance and Sexual Activity  Alcohol Use Yes  . Alcohol/week: 2.0 standard drinks  . Types: 2 Shots of liquor per week   Comment: couple of drinks of [place] every night    Social History   Tobacco Use   Smoking Status Former Smoker  . Packs/day: 2.00  . Years: 35.00  . Pack years: 70.00  . Types: Cigarettes  . Quit date: 10/15/2008  . Years since quitting: 11.8  Smokeless Tobacco Never Used    Review of Systems  Constitutional: Negative.   HENT: Negative.   Eyes: Negative.   Respiratory: Negative.   Cardiovascular: Negative.   Gastrointestinal: Negative.   Genitourinary: Negative.   Musculoskeletal: Positive for joint pain.  Skin: Negative.   Neurological: Negative.   Endo/Heme/Allergies: Negative.   Psychiatric/Behavioral: Negative.     Objective   Vitals:   08/04/20 1530  BP: 131/83  Pulse: 73  Resp: 14  Temp: 97.8 F (36.6 C)  SpO2: 94%    Physical Exam Vitals reviewed.  Constitutional:      Appearance: Normal appearance. He is not ill-appearing.  HENT:     Head: Normocephalic and atraumatic.  Cardiovascular:     Rate and Rhythm: Normal rate and regular rhythm.     Heart sounds: Normal heart sounds. No murmur heard. No friction rub. No gallop.   Pulmonary:     Effort: Pulmonary effort is normal. No respiratory distress.     Breath sounds: Normal breath sounds. No stridor. No wheezing, rhonchi or rales.  Abdominal:     General: Bowel sounds are normal. There is no distension.     Palpations: Abdomen is soft. There is no mass.     Tenderness: There is no abdominal tenderness. There is no guarding or rebound.     Hernia: No hernia is present.     Comments: Patient does have some laxity of the left inguinal floor, but I could not appreciate a discrete hernia.  He also has laxity of the right inguinal floor.  He was examined while in the supine and standing positions.  Genitourinary:    Testes: Normal.  Skin:    General: Skin is warm and dry.  Neurological:     Mental Status: He is alert and oriented to person, place, and time.   Dr. Noland Fordyce notes reviewed  Assessment  Left groin pain, resolved.  No specific hernia appreciated at this time. Plan    Inguinal hernia literature was given.  Patient was instructed to return to my care should he develop a mass in the left groin or recurrent pain.  He understands and agrees.  Follow-up as needed.

## 2020-08-21 DIAGNOSIS — G4733 Obstructive sleep apnea (adult) (pediatric): Secondary | ICD-10-CM | POA: Diagnosis not present

## 2020-08-24 DIAGNOSIS — G4733 Obstructive sleep apnea (adult) (pediatric): Secondary | ICD-10-CM | POA: Diagnosis not present

## 2020-09-21 DIAGNOSIS — G4733 Obstructive sleep apnea (adult) (pediatric): Secondary | ICD-10-CM | POA: Diagnosis not present

## 2020-10-22 DIAGNOSIS — G4733 Obstructive sleep apnea (adult) (pediatric): Secondary | ICD-10-CM | POA: Diagnosis not present

## 2020-11-06 DIAGNOSIS — M1990 Unspecified osteoarthritis, unspecified site: Secondary | ICD-10-CM | POA: Diagnosis not present

## 2020-11-06 DIAGNOSIS — E785 Hyperlipidemia, unspecified: Secondary | ICD-10-CM | POA: Diagnosis not present

## 2020-11-06 DIAGNOSIS — I10 Essential (primary) hypertension: Secondary | ICD-10-CM | POA: Diagnosis not present

## 2020-11-06 DIAGNOSIS — Z79899 Other long term (current) drug therapy: Secondary | ICD-10-CM | POA: Diagnosis not present

## 2020-11-06 DIAGNOSIS — Z125 Encounter for screening for malignant neoplasm of prostate: Secondary | ICD-10-CM | POA: Diagnosis not present

## 2020-11-06 DIAGNOSIS — G2581 Restless legs syndrome: Secondary | ICD-10-CM | POA: Diagnosis not present

## 2020-11-12 DIAGNOSIS — G2581 Restless legs syndrome: Secondary | ICD-10-CM | POA: Diagnosis not present

## 2020-11-12 DIAGNOSIS — I10 Essential (primary) hypertension: Secondary | ICD-10-CM | POA: Diagnosis not present

## 2020-11-12 DIAGNOSIS — Z23 Encounter for immunization: Secondary | ICD-10-CM | POA: Diagnosis not present

## 2020-11-12 DIAGNOSIS — G4700 Insomnia, unspecified: Secondary | ICD-10-CM | POA: Diagnosis not present

## 2020-11-12 DIAGNOSIS — E785 Hyperlipidemia, unspecified: Secondary | ICD-10-CM | POA: Diagnosis not present

## 2020-11-21 DIAGNOSIS — G4733 Obstructive sleep apnea (adult) (pediatric): Secondary | ICD-10-CM | POA: Diagnosis not present

## 2020-11-26 DIAGNOSIS — S61210A Laceration without foreign body of right index finger without damage to nail, initial encounter: Secondary | ICD-10-CM | POA: Diagnosis not present

## 2020-11-26 DIAGNOSIS — Z23 Encounter for immunization: Secondary | ICD-10-CM | POA: Diagnosis not present

## 2020-12-01 DIAGNOSIS — G4733 Obstructive sleep apnea (adult) (pediatric): Secondary | ICD-10-CM | POA: Diagnosis not present

## 2020-12-15 DIAGNOSIS — Z20822 Contact with and (suspected) exposure to covid-19: Secondary | ICD-10-CM | POA: Diagnosis not present

## 2020-12-15 DIAGNOSIS — J019 Acute sinusitis, unspecified: Secondary | ICD-10-CM | POA: Diagnosis not present

## 2020-12-15 DIAGNOSIS — B9789 Other viral agents as the cause of diseases classified elsewhere: Secondary | ICD-10-CM | POA: Diagnosis not present

## 2020-12-15 DIAGNOSIS — M791 Myalgia, unspecified site: Secondary | ICD-10-CM | POA: Diagnosis not present

## 2020-12-22 DIAGNOSIS — G4733 Obstructive sleep apnea (adult) (pediatric): Secondary | ICD-10-CM | POA: Diagnosis not present

## 2020-12-28 DIAGNOSIS — M25561 Pain in right knee: Secondary | ICD-10-CM | POA: Diagnosis not present

## 2020-12-29 DIAGNOSIS — G4733 Obstructive sleep apnea (adult) (pediatric): Secondary | ICD-10-CM | POA: Diagnosis not present

## 2021-01-14 DIAGNOSIS — Z961 Presence of intraocular lens: Secondary | ICD-10-CM | POA: Diagnosis not present

## 2021-01-14 DIAGNOSIS — H26493 Other secondary cataract, bilateral: Secondary | ICD-10-CM | POA: Diagnosis not present

## 2021-01-14 DIAGNOSIS — H0102B Squamous blepharitis left eye, upper and lower eyelids: Secondary | ICD-10-CM | POA: Diagnosis not present

## 2021-01-14 DIAGNOSIS — H0102A Squamous blepharitis right eye, upper and lower eyelids: Secondary | ICD-10-CM | POA: Diagnosis not present

## 2021-01-14 DIAGNOSIS — H43813 Vitreous degeneration, bilateral: Secondary | ICD-10-CM | POA: Diagnosis not present

## 2021-01-21 DIAGNOSIS — G4733 Obstructive sleep apnea (adult) (pediatric): Secondary | ICD-10-CM | POA: Diagnosis not present

## 2021-02-21 DIAGNOSIS — G4733 Obstructive sleep apnea (adult) (pediatric): Secondary | ICD-10-CM | POA: Diagnosis not present

## 2021-03-01 DIAGNOSIS — J4520 Mild intermittent asthma, uncomplicated: Secondary | ICD-10-CM | POA: Diagnosis not present

## 2021-03-01 DIAGNOSIS — G4733 Obstructive sleep apnea (adult) (pediatric): Secondary | ICD-10-CM | POA: Diagnosis not present

## 2021-03-02 DIAGNOSIS — G4733 Obstructive sleep apnea (adult) (pediatric): Secondary | ICD-10-CM | POA: Diagnosis not present

## 2021-03-05 DIAGNOSIS — H8110 Benign paroxysmal vertigo, unspecified ear: Secondary | ICD-10-CM | POA: Diagnosis not present

## 2021-03-05 DIAGNOSIS — J4520 Mild intermittent asthma, uncomplicated: Secondary | ICD-10-CM | POA: Diagnosis not present

## 2021-03-10 DIAGNOSIS — M9901 Segmental and somatic dysfunction of cervical region: Secondary | ICD-10-CM | POA: Diagnosis not present

## 2021-03-10 DIAGNOSIS — M47812 Spondylosis without myelopathy or radiculopathy, cervical region: Secondary | ICD-10-CM | POA: Diagnosis not present

## 2021-03-10 DIAGNOSIS — R42 Dizziness and giddiness: Secondary | ICD-10-CM | POA: Diagnosis not present

## 2021-03-12 DIAGNOSIS — M9901 Segmental and somatic dysfunction of cervical region: Secondary | ICD-10-CM | POA: Diagnosis not present

## 2021-03-12 DIAGNOSIS — R42 Dizziness and giddiness: Secondary | ICD-10-CM | POA: Diagnosis not present

## 2021-03-12 DIAGNOSIS — M47812 Spondylosis without myelopathy or radiculopathy, cervical region: Secondary | ICD-10-CM | POA: Diagnosis not present

## 2021-03-19 DIAGNOSIS — R42 Dizziness and giddiness: Secondary | ICD-10-CM | POA: Diagnosis not present

## 2021-03-19 DIAGNOSIS — M9901 Segmental and somatic dysfunction of cervical region: Secondary | ICD-10-CM | POA: Diagnosis not present

## 2021-03-19 DIAGNOSIS — M47812 Spondylosis without myelopathy or radiculopathy, cervical region: Secondary | ICD-10-CM | POA: Diagnosis not present

## 2021-03-24 DIAGNOSIS — G4733 Obstructive sleep apnea (adult) (pediatric): Secondary | ICD-10-CM | POA: Diagnosis not present

## 2021-03-25 DIAGNOSIS — R42 Dizziness and giddiness: Secondary | ICD-10-CM | POA: Diagnosis not present

## 2021-03-25 DIAGNOSIS — M47812 Spondylosis without myelopathy or radiculopathy, cervical region: Secondary | ICD-10-CM | POA: Diagnosis not present

## 2021-03-25 DIAGNOSIS — M9901 Segmental and somatic dysfunction of cervical region: Secondary | ICD-10-CM | POA: Diagnosis not present

## 2021-04-01 DIAGNOSIS — R42 Dizziness and giddiness: Secondary | ICD-10-CM | POA: Diagnosis not present

## 2021-04-01 DIAGNOSIS — M47812 Spondylosis without myelopathy or radiculopathy, cervical region: Secondary | ICD-10-CM | POA: Diagnosis not present

## 2021-04-01 DIAGNOSIS — M9901 Segmental and somatic dysfunction of cervical region: Secondary | ICD-10-CM | POA: Diagnosis not present

## 2021-04-13 DIAGNOSIS — M9901 Segmental and somatic dysfunction of cervical region: Secondary | ICD-10-CM | POA: Diagnosis not present

## 2021-04-13 DIAGNOSIS — R42 Dizziness and giddiness: Secondary | ICD-10-CM | POA: Diagnosis not present

## 2021-04-13 DIAGNOSIS — M47812 Spondylosis without myelopathy or radiculopathy, cervical region: Secondary | ICD-10-CM | POA: Diagnosis not present

## 2021-04-14 DIAGNOSIS — R42 Dizziness and giddiness: Secondary | ICD-10-CM | POA: Diagnosis not present

## 2021-04-16 DIAGNOSIS — Z79899 Other long term (current) drug therapy: Secondary | ICD-10-CM | POA: Diagnosis not present

## 2021-04-16 DIAGNOSIS — E785 Hyperlipidemia, unspecified: Secondary | ICD-10-CM | POA: Diagnosis not present

## 2021-04-27 DIAGNOSIS — R42 Dizziness and giddiness: Secondary | ICD-10-CM | POA: Diagnosis not present

## 2021-04-27 DIAGNOSIS — E785 Hyperlipidemia, unspecified: Secondary | ICD-10-CM | POA: Diagnosis not present

## 2021-05-07 DIAGNOSIS — Z23 Encounter for immunization: Secondary | ICD-10-CM | POA: Diagnosis not present

## 2021-05-07 DIAGNOSIS — R42 Dizziness and giddiness: Secondary | ICD-10-CM | POA: Diagnosis not present

## 2021-05-07 DIAGNOSIS — I10 Essential (primary) hypertension: Secondary | ICD-10-CM | POA: Diagnosis not present

## 2021-05-07 DIAGNOSIS — Z6835 Body mass index (BMI) 35.0-35.9, adult: Secondary | ICD-10-CM | POA: Diagnosis not present

## 2021-05-12 ENCOUNTER — Other Ambulatory Visit (HOSPITAL_COMMUNITY): Payer: Self-pay | Admitting: Neurosurgery

## 2021-05-12 ENCOUNTER — Other Ambulatory Visit: Payer: Self-pay | Admitting: Neurosurgery

## 2021-05-12 DIAGNOSIS — R42 Dizziness and giddiness: Secondary | ICD-10-CM

## 2021-05-26 ENCOUNTER — Ambulatory Visit (HOSPITAL_COMMUNITY)
Admission: RE | Admit: 2021-05-26 | Discharge: 2021-05-26 | Disposition: A | Discharge status: Home / Self Care | Payer: Medicare HMO | Source: Ambulatory Visit | Attending: Neurosurgery | Admitting: Neurosurgery

## 2021-05-26 ENCOUNTER — Other Ambulatory Visit: Payer: Self-pay

## 2021-05-26 DIAGNOSIS — G4733 Obstructive sleep apnea (adult) (pediatric): Secondary | ICD-10-CM | POA: Diagnosis not present

## 2021-05-26 DIAGNOSIS — R42 Dizziness and giddiness: Secondary | ICD-10-CM | POA: Insufficient documentation

## 2021-05-26 DIAGNOSIS — R0602 Shortness of breath: Secondary | ICD-10-CM | POA: Diagnosis not present

## 2021-05-26 DIAGNOSIS — E7800 Pure hypercholesterolemia, unspecified: Secondary | ICD-10-CM | POA: Diagnosis not present

## 2021-05-26 DIAGNOSIS — Z6835 Body mass index (BMI) 35.0-35.9, adult: Secondary | ICD-10-CM | POA: Diagnosis not present

## 2021-05-26 DIAGNOSIS — E669 Obesity, unspecified: Secondary | ICD-10-CM | POA: Diagnosis not present

## 2021-05-26 DIAGNOSIS — I10 Essential (primary) hypertension: Secondary | ICD-10-CM | POA: Diagnosis not present

## 2021-05-26 DIAGNOSIS — Z9989 Dependence on other enabling machines and devices: Secondary | ICD-10-CM | POA: Diagnosis not present

## 2021-06-04 DIAGNOSIS — G4733 Obstructive sleep apnea (adult) (pediatric): Secondary | ICD-10-CM | POA: Diagnosis not present

## 2021-06-08 ENCOUNTER — Encounter: Payer: Self-pay | Admitting: Psychiatry

## 2021-06-10 ENCOUNTER — Encounter: Payer: Self-pay | Admitting: Psychiatry

## 2021-06-10 ENCOUNTER — Other Ambulatory Visit: Payer: Self-pay

## 2021-06-10 ENCOUNTER — Ambulatory Visit: Payer: Medicare HMO | Admitting: Psychiatry

## 2021-06-10 VITALS — BP 141/80 | HR 77 | Ht 70.0 in | Wt 249.8 lb

## 2021-06-10 DIAGNOSIS — R42 Dizziness and giddiness: Secondary | ICD-10-CM | POA: Diagnosis not present

## 2021-06-10 MED ORDER — MECLIZINE HCL 25 MG PO TABS: 25.0000 mg | ORAL_TABLET | Freq: Three times a day (TID) | ORAL | 0 refills | Status: DC | PRN

## 2021-06-10 MED ORDER — GABAPENTIN 300 MG PO CAPS: 300.0000 mg | ORAL_CAPSULE | Freq: Three times a day (TID) | ORAL | 2 refills | Status: DC

## 2021-06-10 NOTE — Progress Notes (Signed)
[facility]  PATIENT: Ryan [name] DOB: [birth year]  REFERRING CLINICIAN: Vallarie Mare, MD HISTORY FROM: self and wife REASON FOR VISIT: vertigo   HISTORICAL  CHIEF COMPLAINT:  Chief Complaint  Patient presents with   Dizziness    Rm 6 New Pt wife- Judeen Hammans "have seen chiropractor, PT, taken antibiotics, used Nasocort, taken Meclizine- none really helped"     HISTORY OF PRESENT ILLNESS:  The patient presents for evaluation of dizziness which began in August 2022. Will feel like he is leaning to the left or like the room is spinning. Lying down and looking up triggers the dizziness. This is mostly present when he's working under cars. Does not seem to be triggered by turning head any particular direction. Braking heavily while racing triggers it as well. Vertigo can last for a few minutes or it can last all day. This is typically associated with headaches.  Has had daily headaches since August when the dizziness began.  Headache is bifrontal and is most prominent at the end of the day. Headache is a 4-5/10. No associated photophobia, phonophobia, or nausea.  Notes he had similar episodes of dizziness when his blood pressure was too low a few years ago.  OTHER MEDICAL CONDITIONS: COPD, HTN, HLD, OSA on CPAP   REVIEW OF SYSTEMS: Full 14 system review of systems performed and negative with exception of: vertigo  ALLERGIES: Allergies  Allergen Reactions   Latex Other (See Comments)    Skin redness   Tiotropium Bromide Monohydrate Other (See Comments)    Patient reports blurred vision after taking Spiriva    HOME MEDICATIONS: Outpatient Medications Prior to Visit  Medication Sig Dispense Refill   fluticasone-salmeterol (ADVAIR HFA) 115-21 MCG/ACT inhaler Inhale 2 puffs into the lungs 2 (two) times daily.      lansoprazole (PREVACID) 15 MG capsule Take 15 mg by mouth daily at 12 noon.     levalbuterol (XOPENEX HFA) 45 MCG/ACT inhaler Inhale into the  lungs every 4 (four) hours as needed for wheezing.     mirtazapine (REMERON) 15 MG tablet Take 15 mg by mouth at bedtime.     rosuvastatin (CRESTOR) 20 MG tablet Take by mouth.     valsartan (DIOVAN) 160 MG tablet Take 160 mg by mouth daily.     zolpidem (AMBIEN) 10 MG tablet Take 10 mg by mouth at bedtime as needed for sleep.      mirtazapine (REMERON) 15 MG tablet Take 15 mg by mouth at bedtime.      No facility-administered medications prior to visit.    PAST MEDICAL HISTORY: Past Medical History:  Diagnosis Date   Anemia    Arthritis    Asthma    COPD (chronic obstructive pulmonary disease) (HCC)    Depression    GERD (gastroesophageal reflux disease)    High cholesterol    diet controlled   Hypertension    diet controlled   [facility] spotted fever 2019   Sleep apnea    CPAP    PAST SURGICAL HISTORY: Past Surgical History:  Procedure Laterality Date   CATARACT EXTRACTION  2018   CATARACT EXTRACTION W/PHACO Right 02/23/2015   Procedure: CATARACT EXTRACTION PHACO AND INTRAOCULAR LENS PLACEMENT ([place]);  Surgeon: Williams Che, MD;  Location: [facility];  Service: Ophthalmology;  Laterality: Right;  CDE:5.02   CHOLECYSTECTOMY     COLONOSCOPY  03/15/2005   Dr. Rourk:Rectal polyps (diminutive), status post cold biopsy/removal/normal colon. hyperplastic   COLONOSCOPY N/A 08/27/2014  Dr. Gala Romney: Multiple rectal and colonic polyps removed, one which was a 1 cm sessile polyp. Cecal AVMS ablated as described above. colonic Diverticulosis. Path with rectal hyperplastic polyp, ascending colon polyps with benign colonic mucosa, sessile serrated polyp with associated lipoma. Recommendation for surveillance colonoscopy in 3 years.    COLONOSCOPY WITH PROPOFOL N/A 12/25/2017   Procedure: COLONOSCOPY WITH PROPOFOL;  Surgeon: Daneil Dolin, MD;  [facility];  Service: Endoscopy;  Laterality: N/A;  1:45pm   ESOPHAGOGASTRODUODENOSCOPY N/A 08/27/2014   Dr. Rourk:Tiny distal  esophageal erosions consistent with mild erosive reflux esophaigitis. Hiatal hernia. Antral erosions status post gastric biopsy. mild chronic gastritis, negative H.pylori   GIVENS CAPSULE STUDY N/A 11/05/2014   Procedure: GIVENS CAPSULE STUDY;  Surgeon: Daneil Dolin, MD;  [facility];  Service: Endoscopy;  Laterality: N/A;  0800   KNEE ARTHROSCOPY Right    POLYPECTOMY  12/25/2017   Procedure: POLYPECTOMY;  Surgeon: Daneil Dolin, MD;  [facility]: [facility];  Service: Endoscopy;;   TOTAL SHOULDER ARTHROPLASTY Left 02/28/2019   Procedure: TOTAL SHOULDER ARTHROPLASTY;  Surgeon: Justice Britain, MD;  Location: [facility];  Service: Orthopedics;  Laterality: Left;  174min    FAMILY HISTORY: Family History  Problem Relation Age of Onset   Cancer Mother        unsure primary   Cancer Father        prostate   Colon cancer Neg Hx     SOCIAL HISTORY: Social History   Socioeconomic History   Marital status: Married    Spouse name: Judeen Hammans   Number of children: 0   Years of education: Not on file   Highest education level: Some college, no degree  Occupational History   Occupation: [employer] account executive  Tobacco Use   Smoking status: Former    Packs/day: 2.00    Years: 35.00    Pack years: 70.00    Types: Cigarettes    Quit date: 10/15/2008    Years since quitting: 12.6   Smokeless tobacco: Never  Vaping Use   Vaping Use: Never used  Substance and Sexual Activity   Alcohol use: Yes    Alcohol/week: 2.0 standard drinks    Types: 2 Shots of liquor per week    Comment: couple of drinks of [place] every night   Drug use: No   Sexual activity: Yes    Birth control/protection: None  Other Topics Concern   Not on file  Social History Narrative   Lives with wife   Caffeine-coffee 3-4 c daily   Social Determinants of Health   Financial Resource Strain: Not on file  Food Insecurity: Not on file  Transportation Needs: Not on file  Physical Activity: Not on file   Stress: Not on file  Social Connections: Not on file  Intimate Partner Violence: Not on file     PHYSICAL EXAM  GENERAL EXAM/CONSTITUTIONAL: Vitals:  Vitals:   06/10/21 1454  BP: (!) 141/80  Pulse: 77  Weight: 249 lb 12.8 oz (113.3 kg)  Height: 5\' 10"  (1.778 m)   Body mass index is 35.84 kg/m. Wt Readings from Last 3 Encounters:  06/10/21 249 lb 12.8 oz (113.3 kg)  08/04/20 247 lb (112 kg)  06/17/20 240 lb (108.9 kg)   Patient is in no distress; well developed, nourished and groomed; neck is supple  CARDIOVASCULAR: Examination of peripheral vascular system by observation and palpation is normal  EYES: Pupils round and reactive to light, Visual fields full to  confrontation, Extraocular movements intacts,   MUSCULOSKELETAL: Gait, strength, tone, movements noted in Neurologic exam below  NEUROLOGIC: MENTAL STATUS:  awake, alert, oriented to person, place and time recent and remote memory intact normal attention and concentration  CRANIAL NERVE:  2nd, 3rd, 4th, 6th - pupils equal and reactive to light, visual fields full to confrontation, extraocular muscles intact, no nystagmus 5th - facial sensation symmetric 7th - facial strength symmetric 8th - hearing intact 9th - palate elevates symmetrically, uvula midline 11th - shoulder shrug symmetric 12th - tongue protrusion midline  MOTOR:  normal bulk and tone, full strength in the BUE, BLE  SENSORY:  normal and symmetric to light touch all 4 extremities  COORDINATION:  finger-nose-finger, fheel-to-shin intact  REFLEXES:  deep tendon reflexes present and symmetric  GAIT/STATION:  normal     DIAGNOSTIC DATA (LABS, IMAGING, TESTING) - I reviewed patient records, labs, notes, testing and imaging myself where available.  Lab Results  Component Value Date   WBC 7.7 02/25/2019   HGB 14.6 02/25/2019   HCT 45.8 02/25/2019   MCV 97.9 02/25/2019   PLT 235 02/25/2019      Component Value Date/Time   NA  141 02/25/2019 1445   K 5.5 (H) 02/25/2019 1445   CL 105 02/25/2019 1445   CO2 26 02/25/2019 1445   GLUCOSE 93 02/25/2019 1445   BUN 16 02/25/2019 1445   CREATININE 1.12 02/25/2019 1445   CALCIUM 9.6 02/25/2019 1445   GFRNONAA >60 02/25/2019 1445   GFRAA >60 02/25/2019 1445   No results found for: CHOL, HDL, LDLCALC, LDLDIRECT, TRIG, CHOLHDL No results found for: HGBA1C No results found for: VITAMINB12 No results found for: TSH  MRI brain without contrast 05/26/21: unremarkable   ASSESSMENT AND PLAN  65 y.o. year old male with a history of COPD, HTN, HLD, OSA on CPAP who presents for evaluation of vertigo and headaches which began August 2022. MRI brain and neurologic exam today are unremarkable. Headaches do not sound migrainous, however his vertigo and headaches do seem to be related. Will start gabapentin to treat his headache and see if vertigo improves. Referral to vestibular rehab placed to help with his vertigo symptoms. Will provide meclizine to take as needed for severe episodes of vertigo.   1. Vertigo       PLAN: -Start gabapentin 300 mg TID -Start meclizine 25 mg PRN for severe vertigo episodes -Referral to vestibular rehab  Orders Placed This Encounter  Procedures   Ambulatory referral to Physical Therapy     Meds ordered this encounter  Medications   gabapentin (NEURONTIN) 300 MG capsule    Sig: Take 1 capsule (300 mg total) by mouth 3 (three) times daily.    Dispense:  270 capsule    Refill:  2   meclizine (ANTIVERT) 25 MG tablet    Sig: Take 1 tablet (25 mg total) by mouth 3 (three) times daily as needed for dizziness.    Dispense:  30 tablet    Refill:  0     Return in about 3 months (around 09/08/2021).    Genia Harold, MD 06/11/21 4:29 PM  I spent an average of 35 minutes chart reviewing and counseling the patient, with at least 50% of the time face to face with the patient.   [facility] [address] [place], [place] [address] [phone]

## 2021-06-10 NOTE — Patient Instructions (Addendum)
Referral to vestibular therapy Start gabapentin 300 mg at bedtime for 5 days, then 300 mg twice a day for 5 days, then 300 mg three times a day for headache prevention Try meclizine 25 mg as needed for vertigo

## 2021-06-30 ENCOUNTER — Other Ambulatory Visit: Payer: Self-pay

## 2021-06-30 ENCOUNTER — Encounter (HOSPITAL_COMMUNITY): Payer: Self-pay | Admitting: Physical Therapy

## 2021-06-30 ENCOUNTER — Ambulatory Visit (HOSPITAL_COMMUNITY): Payer: Medicare HMO | Attending: Psychiatry | Admitting: Physical Therapy

## 2021-06-30 DIAGNOSIS — R42 Dizziness and giddiness: Secondary | ICD-10-CM | POA: Diagnosis not present

## 2021-06-30 NOTE — Patient Instructions (Signed)
Access Code: [email or web address] URL: [email or web address] Date: 06/30/2021 Prepared by: Josue Hector  Exercises Seated Scapular Retraction - 2-3 x daily - 7 x weekly - 1-2 sets - 10 reps - 5 second hold Seated Cervical Retraction - 2-3 x daily - 7 x weekly - 1-2 sets - 10 reps - 5 second hold Seated Isometric Cervical Sidebending - 2-3 x daily - 7 x weekly - 1-2 sets - 10 reps - 5 second hold

## 2021-07-01 NOTE — Therapy (Signed)
[place], [place], [address] Phone: [phone]   Fax:  [phone]  Physical Therapy Evaluation  Patient Details  Name: Ryan [name] MRN: [id number] Date of Birth: 1955/07/18 Referring Provider (PT): Genia Harold MD   Encounter Date: 06/30/2021   PT End of Session - 06/30/21 1731     Visit Number 1    Number of Visits 3    Date for PT Re-Evaluation 07/28/21    Authorization Type [employer]    PT Start Time 1640    PT Stop Time 7412    PT Time Calculation (min) 50 min    Activity Tolerance Patient tolerated treatment well    Behavior During Therapy [facility] for tasks assessed/performed             Past Medical History:  Diagnosis Date   Anemia    Arthritis    Asthma    COPD (chronic obstructive pulmonary disease) ([place])    Depression    GERD (gastroesophageal reflux disease)    High cholesterol    diet controlled   Hypertension    diet controlled   [facility] spotted fever 2019   Sleep apnea    CPAP    Past Surgical History:  Procedure Laterality Date   CATARACT EXTRACTION  2018   CATARACT EXTRACTION W/PHACO Right 02/23/2015   Procedure: CATARACT EXTRACTION PHACO AND INTRAOCULAR LENS PLACEMENT ([place]);  Surgeon: Williams Che, MD;  Location: [facility];  Service: Ophthalmology;  Laterality: Right;  CDE:5.02   CHOLECYSTECTOMY     COLONOSCOPY  03/15/2005   Dr. Rourk:Rectal polyps (diminutive), status post cold biopsy/removal/normal colon. hyperplastic   COLONOSCOPY N/A 08/27/2014   Dr. Gala Romney: Multiple rectal and colonic polyps removed, one which was a 1 cm sessile polyp. Cecal AVMS ablated as described above. colonic Diverticulosis. Path with rectal hyperplastic polyp, ascending colon polyps with benign colonic mucosa, sessile serrated polyp with associated lipoma. Recommendation for surveillance colonoscopy in 3 years.    COLONOSCOPY WITH PROPOFOL N/A 12/25/2017   Procedure: COLONOSCOPY WITH  PROPOFOL;  Surgeon: Daneil Dolin, MD;  [facility];  Service: Endoscopy;  Laterality: N/A;  1:45pm   ESOPHAGOGASTRODUODENOSCOPY N/A 08/27/2014   Dr. Rourk:Tiny distal esophageal erosions consistent with mild erosive reflux esophaigitis. Hiatal hernia. Antral erosions status post gastric biopsy. mild chronic gastritis, negative H.pylori   GIVENS CAPSULE STUDY N/A 11/05/2014   Procedure: GIVENS CAPSULE STUDY;  Surgeon: Daneil Dolin, MD;  [facility];  Service: Endoscopy;  Laterality: N/A;  0800   KNEE ARTHROSCOPY Right    POLYPECTOMY  12/25/2017   Procedure: POLYPECTOMY;  Surgeon: Daneil Dolin, MD;  [facility];  Service: Endoscopy;;   TOTAL SHOULDER ARTHROPLASTY Left 02/28/2019   Procedure: TOTAL SHOULDER ARTHROPLASTY;  Surgeon: Justice Britain, MD;  [facility];  Service: Orthopedics;  Laterality: Left;  169min    There were no vitals filed for this visit.    Subjective Assessment - 07/01/21 0913     Subjective Patient presents to therapy with complaint of dizziness. Patient states symptoms began around August of this year. He states symptoms have been improving. Dizziness only occurs when working underneath his car and when coming to a stop in his racecar. He has had MRI and head scan which were negative for vascular findings. He has been to a chiropractor and had Eppley maneuver performed, also saw a vestibular therapist who completed further testing but no notable findings relative  to dizziness symptoms. He monitors his BP regularly and reports no significant changes recently.    Patient Stated Goals Decrease dizziness    Currently in Pain? No/denies                [facility] PT Assessment - 07/01/21 0001       Assessment   Medical Diagnosis Vertigo    Referring Provider (PT) Genia Harold MD      Precautions   Precautions None      Restrictions   Weight Bearing Restrictions No      Balance Screen   Has the patient fallen in the past  6 months No      Prior Function   Level of Independence Independent      Cognition   Overall Cognitive Status Within Functional Limits for tasks assessed      Posture/Postural Control   Posture/Postural Control Postural limitations    Postural Limitations Rounded Shoulders      ROM / Strength   AROM / PROM / Strength AROM      AROM   Overall AROM Comments Cervical AROM [facility]                    Vestibular Assessment - 07/01/21 0001       Symptom Behavior   Type of Dizziness  Spinning    Symptom Nature Motion provoked;Positional    Aggravating Factors --   braking in racecar, laying supine under car   Relieving Factors Closing eyes    Progression of Symptoms Better      Oculomotor Exam   Oculomotor Alignment Normal    Ocular ROM Intact    Smooth Pursuits Comment   Poor tracking vertically in LT visual field   Saccades Intact      Vestibulo-Ocular Reflex   VOR 1 Head Only (x 1 viewing) Intact      Positional Testing   Dix-Hallpike Dix-Hallpike Right;Dix-Hallpike Left      Dix-Hallpike Right   Dix-Hallpike Right Symptoms No nystagmus      Dix-Hallpike Left   Dix-Hallpike Left Duration 3 second    Dix-Hallpike Left Symptoms Upbeat, right rotatory nystagmus   very low amplitude     Positional Sensitivities   Sit to Supine No dizziness    Head Turning x 5 No dizziness    Head Nodding x 5 No dizziness    Rolling Right No dizziness    Rolling Left No dizziness                Objective measurements completed on examination: See above findings.       [place] Adult PT Treatment/Exercise - 07/01/21 0001       Exercises   Exercises Shoulder      Shoulder Exercises: Seated   Other Seated Exercises chin tuck 5x5", scap retraction 5x5", cervcial isometric 5 x 5"                     PT Education - 06/30/21 1730     Education Details on evaluation findings, POC and HEP    Person(s) Educated Patient    Methods Explanation;Handout     Comprehension Verbalized understanding              PT Short Term Goals - 07/01/21 0856       PT SHORT TERM GOAL #1   Title Patient will be independent with initial HEP and self-management strategies to improve functional outcomes    Time  2    Period Weeks    Status New    Target Date 07/14/21               PT Long Term Goals - 07/01/21 0857       PT LONG TERM GOAL #1   Title Patient will report at least 75% overall improvement in subjective complaint to indicate improvement in ability to perform ADLs.    Time 3    Period Weeks    Status New    Target Date 07/21/21      PT LONG TERM GOAL #2   Title Patient will report no bouts of vertigo while driving or when wokring on car to improve QOL and ability to perform ADLs    Time 3    Period Weeks    Status New    Target Date 07/21/21                    Plan - 07/01/21 0844     Clinical Impression Statement Patient is a 65 y.o. male who presents to physical therapy with complaint of dizziness. Patient demonstrates mild symptoms with provocative vestibular testing, altered visual tracking, and minor postural deficits which are negatively impacting patient ability to perform ADLs and functional mobility tasks. Patient will benefit from skilled physical therapy services to address these deficits to improve level of function with ADLs and functional mobility tasks.    Examination-Activity Limitations Locomotion Level;Lift;Bend;Transfers;Other    Examination-Participation Restrictions Community Activity;Yard Work;Driving    Stability/Clinical Decision Making Stable/Uncomplicated    Clinical Decision Making Low    Rehab Potential Good    PT Frequency 1x / week    PT Duration 3 weeks    PT Treatment/Interventions ADLs/Self Care Home Management;Biofeedback;Cryotherapy;Electrical Stimulation;Iontophoresis 4mg /ml Dexamethasone;Traction;Moist Heat;Balance training;Therapeutic exercise;Manual techniques;Vasopneumatic  Device;Therapeutic activities;Functional mobility training;Splinting;Taping;Energy conservation;Orthotic Fit/Training;Stair training;Gait training;DME Instruction;Patient/family education;Contrast Bath;Fluidtherapy;Parrafin;Ultrasound;Neuromuscular re-education;Compression bandaging;Scar mobilization;Passive range of motion;Dry needling;Visual/perceptual remediation/compensation;Spinal Manipulations;Joint Manipulations;Canalith Repostioning;Vestibular    PT Next Visit Plan Review HEP. f/u with positional dizziness symptoms. 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   [place], [place], [address] Phone: [phone]   Fax:  [phone]  Name: Ryan [name] MRN: [id number] Date of Birth: 11-03-1955

## 2021-07-13 ENCOUNTER — Ambulatory Visit (HOSPITAL_COMMUNITY): Payer: Medicare HMO | Admitting: Physical Therapy

## 2021-07-29 DIAGNOSIS — E785 Hyperlipidemia, unspecified: Secondary | ICD-10-CM | POA: Diagnosis not present

## 2021-07-29 DIAGNOSIS — Z79899 Other long term (current) drug therapy: Secondary | ICD-10-CM | POA: Diagnosis not present

## 2021-08-02 ENCOUNTER — Encounter (HOSPITAL_COMMUNITY): Payer: Medicare HMO

## 2021-08-02 ENCOUNTER — Telehealth (HOSPITAL_COMMUNITY): Payer: Self-pay

## 2021-08-02 NOTE — Telephone Encounter (Signed)
No call, no show.  5:00 PM, 08/02/21 M. Sherlyn Lees, PT, DPT Physical Therapist- [place] Office Number: [phone]

## 2021-08-04 ENCOUNTER — Other Ambulatory Visit: Payer: Self-pay

## 2021-08-04 ENCOUNTER — Ambulatory Visit (HOSPITAL_COMMUNITY): Payer: Medicare HMO | Attending: Psychiatry

## 2021-08-04 DIAGNOSIS — R42 Dizziness and giddiness: Secondary | ICD-10-CM | POA: Diagnosis not present

## 2021-08-04 NOTE — Therapy (Signed)
[facility] [address] [place], [place], [address] Phone: [phone]   Fax:  [phone]  Physical Therapy Treatment and D/C Summary   Patient Details  Name: Ryan [name] MRN: [id number] Date of Birth: June 11, 1956 Referring Provider (PT): Genia Harold MD  PHYSICAL THERAPY DISCHARGE SUMMARY  Visits from Start of Care: 2  Current functional level related to goals / functional outcomes: Pt notes sporadic symptoms   Remaining deficits: Intermittent episodes    Education / Equipment: HEP   Patient agrees to discharge. Patient goals were not met. Patient is being discharged due to did not respond to therapy.   Encounter Date: 08/04/2021   PT End of Session - 08/04/21 1636     Visit Number 2    Number of Visits 3    Date for PT Re-Evaluation 07/28/21    Authorization Type [employer]    PT Start Time 1640    PT Stop Time 2500   D/C assessment   PT Time Calculation (min) 28 min    Activity Tolerance Patient tolerated treatment well    Behavior During Therapy WFL for tasks assessed/performed             Past Medical History:  Diagnosis Date   Anemia    Arthritis    Asthma    COPD (chronic obstructive pulmonary disease) ([place])    Depression    GERD (gastroesophageal reflux disease)    High cholesterol    diet controlled   Hypertension    diet controlled   [place] fever 2019   Sleep apnea    CPAP    Past Surgical History:  Procedure Laterality Date   CATARACT EXTRACTION  2018   CATARACT EXTRACTION W/PHACO Right 02/23/2015   Procedure: CATARACT EXTRACTION PHACO AND INTRAOCULAR LENS PLACEMENT ([facility]);  Surgeon: Williams Che, MD;  Location: [facility];  Service: Ophthalmology;  Laterality: Right;  CDE:5.02   CHOLECYSTECTOMY     COLONOSCOPY  03/15/2005   Dr. Rourk:Rectal polyps (diminutive), status post cold biopsy/removal/normal colon. hyperplastic   COLONOSCOPY N/A 08/27/2014   Dr. Gala Romney:  Multiple rectal and colonic polyps removed, one which was a 1 cm sessile polyp. Cecal AVMS ablated as described above. colonic Diverticulosis. Path with rectal hyperplastic polyp, ascending colon polyps with benign colonic mucosa, sessile serrated polyp with associated lipoma. Recommendation for surveillance colonoscopy in 3 years.    COLONOSCOPY WITH PROPOFOL N/A 12/25/2017   Procedure: COLONOSCOPY WITH PROPOFOL;  Surgeon: Daneil Dolin, MD;  [facility];  Service: Endoscopy;  Laterality: N/A;  1:45pm   ESOPHAGOGASTRODUODENOSCOPY N/A 08/27/2014   Dr. Rourk:Tiny distal esophageal erosions consistent with mild erosive reflux esophaigitis. Hiatal hernia. Antral erosions status post gastric biopsy. mild chronic gastritis, negative H.pylori   GIVENS CAPSULE STUDY N/A 11/05/2014   Procedure: GIVENS CAPSULE STUDY;  Surgeon: Daneil Dolin, MD;  [facility]: [facility];  Service: Endoscopy;  Laterality: N/A;  0800   KNEE ARTHROSCOPY Right    POLYPECTOMY  12/25/2017   Procedure: POLYPECTOMY;  Surgeon: Daneil Dolin, MD;  [facility]: [facility];  Service: Endoscopy;;   TOTAL SHOULDER ARTHROPLASTY Left 02/28/2019   Procedure: TOTAL SHOULDER ARTHROPLASTY;  Surgeon: Justice Britain, MD;  Location: [facility];  Service: Orthopedics;  Laterality: Left;  172min    There were no vitals filed for this visit.   Subjective Assessment - 08/04/21 1639     Subjective Couple instances of vertigo noted, mainly when laying on back under  car and extending neck, and then when sitting in recliner (upright position) again this triggers the vertigo. Notes that the episodes are brief 15-30 sec duration without lingering effect.    Patient Stated Goals Decrease dizziness                     Vestibular Assessment - 08/04/21 0001       Symptom Behavior   Symptom Nature Motion provoked;Positional      Positional Testing   Dix-Hallpike Dix-Hallpike Right;Dix-Hallpike Left    Sidelying Test Sidelying  Right;Sidelying Left    Horizontal Canal Testing Horizontal Canal Right;Horizontal Canal Left      Dix-Hallpike Right   Dix-Hallpike Right Symptoms No nystagmus      Dix-Hallpike Left   Dix-Hallpike Left Symptoms No nystagmus      Sidelying Right   Sidelying Right Symptoms No nystagmus      Sidelying Left   Sidelying Left Symptoms No nystagmus      Horizontal Canal Right   Horizontal Canal Right Symptoms Normal      Horizontal Canal Left   Horizontal Canal Left Symptoms Normal                               PT Education - 08/04/21 1712     Education Details discussion regarding symptom provocation    Person(s) Educated Patient    Methods Explanation    Comprehension Verbalized understanding              PT Short Term Goals - 08/04/21 1713       PT SHORT TERM GOAL #1   Title Patient will be independent with initial HEP and self-management strategies to improve functional outcomes    Time 2    Period Weeks    Status Achieved    Target Date 07/14/21               PT Long Term Goals - 08/04/21 1713       PT LONG TERM GOAL #1   Title Patient will report at least 75% overall improvement in subjective complaint to indicate improvement in ability to perform ADLs.    Baseline minimal episodes    Time 3    Period Weeks    Status Not Met    Target Date 07/21/21      PT LONG TERM GOAL #2   Title Patient will report no bouts of vertigo while driving or when wokring on car to improve QOL and ability to perform ADLs    Baseline minmal episodes, unable to test with car driving    Time 3    Period Weeks    Status Not Met    Target Date 07/21/21                   Plan - 08/04/21 1709     Clinical Impression Statement returns after one month and notes only a couple of instances of spinning symptoms, once when laying under car and looking up and again when sitting upright and looking up. Positional and provocation testing today did  not elicit any symptoms and no abnormalities detected. No gaze abnormalities appreciated. Patient will f/u with neurologist as pt will likely not derive much benefit from PT services for this condition given the sporadic and specific nature of symptoms (deceleration in race car)    Examination-Activity Limitations Locomotion Level;Lift;Bend;Transfers;Other    Examination-Participation Restrictions Community Activity;[employer]  Work;Driving    Stability/Clinical Decision Making Stable/Uncomplicated    Rehab Potential Good    PT Frequency 1x / week    PT Duration 3 weeks    PT Treatment/Interventions ADLs/Self Care Home Management;Biofeedback;Cryotherapy;Electrical Stimulation;Iontophoresis 4mg /ml Dexamethasone;Traction;Moist Heat;Balance training;Therapeutic exercise;Manual techniques;Vasopneumatic Device;Therapeutic activities;Functional mobility training;Splinting;Taping;Energy conservation;Orthotic Fit/Training;Stair training;Gait training;DME Instruction;Patient/family education;Contrast Bath;Fluidtherapy;Parrafin;Ultrasound;Neuromuscular re-education;Compression bandaging;Scar mobilization;Passive range of motion;Dry needling;Visual/perceptual remediation/compensation;Spinal Manipulations;Joint Manipulations;Canalith Repostioning;Vestibular    PT Next Visit Plan Review HEP. f/u with positional dizziness symptoms. 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

## 2021-08-05 DIAGNOSIS — E785 Hyperlipidemia, unspecified: Secondary | ICD-10-CM | POA: Diagnosis not present

## 2021-08-05 DIAGNOSIS — H8101 Meniere's disease, right ear: Secondary | ICD-10-CM | POA: Diagnosis not present

## 2021-08-26 DIAGNOSIS — H9113 Presbycusis, bilateral: Secondary | ICD-10-CM | POA: Diagnosis not present

## 2021-08-26 DIAGNOSIS — R42 Dizziness and giddiness: Secondary | ICD-10-CM | POA: Diagnosis not present

## 2021-08-30 DIAGNOSIS — H903 Sensorineural hearing loss, bilateral: Secondary | ICD-10-CM | POA: Diagnosis not present

## 2021-09-01 ENCOUNTER — Encounter: Payer: Self-pay | Admitting: Psychiatry

## 2021-09-01 ENCOUNTER — Ambulatory Visit: Payer: Medicare HMO | Admitting: Psychiatry

## 2021-09-01 VITALS — BP 136/86 | HR 64 | Ht 70.0 in | Wt 248.0 lb

## 2021-09-01 DIAGNOSIS — R42 Dizziness and giddiness: Secondary | ICD-10-CM | POA: Diagnosis not present

## 2021-09-01 DIAGNOSIS — G44229 Chronic tension-type headache, not intractable: Secondary | ICD-10-CM

## 2021-09-01 NOTE — Patient Instructions (Addendum)
Start Magnesium Oxide or Magnesium Glycinate 500 mg at bed (up to 800 mg daily)   Magnesium: Magnesium (250 mg twice a day or 500 mg at bed) has a relaxant effect on smooth muscles such as blood vessels. Individuals suffering from frequent or daily headache usually have low magnesium levels which can be increase with daily supplementation of 400-750 mg. Three trials found 40-90% average headache reduction  when used as a preventative. Magnesium is part of the messenger system in the serotonin cascade and it is a good muscle relaxant.  It is also useful for constipation which can be a side effect of other medications used to treat headaches. Good sources include nuts, whole grains, and tomatoes. Side Effects: loose stool/diarrhea  Melatonin: Increasing evidence shows correlation between melatonin secretion and headache conditions.  Melatonin supplementation has decreased headache intensity and duration.  It is widely used as a sleep aid.  Sleep is natures way of dealing with migraine.

## 2021-09-01 NOTE — Progress Notes (Signed)
° °  CC:  vertigo  Follow-up Visit  Last visit: 06/10/21  Brief HPI: 66 year old male with a history of COPD, HTN, HLD, OSA on CPAP who follows in clinic for vertigo and daily headaches which began in August 2022.   At his last visit he was started on gabapentin for headaches and referred to vestibular rehab for vertigo. He was given PRN meclizine for severe episodes of vertigo.  Interval History: Since his last visit he has not noticed much of a difference in headaches or vertigo. His vertigo is still triggered by lying down when he works on his car. It only lasts for a few seconds at a time. He has not tried meclizine because vertigo episodes are so brief. Went to vestibular rehab for a couple of sessions but stopped because they were unable to replicate dizziness during their session.  Still has mild daily headaches. Did not notice much of a difference with gabapentin 300 mg TID so he stopped it.  Has a lot of trouble falling asleep at night due to shoulder pain and discomfort with his CPAP.  Saw ENT who ordered a CTA neck. 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 Audiology recommended vestibular testing, which has not been scheduled yet.   Physical Exam:   Vital Signs: BP 136/86    Pulse 64    Ht 5\' 10"  (1.778 m)    Wt 248 lb (112.5 kg)    BMI 35.58 kg/m  GENERAL:  well appearing, in no acute distress, alert  SKIN:  Color, texture, turgor normal. No rashes or lesions HEAD:  Normocephalic/atraumatic. RESP: normal respiratory effort MSK:  No gross joint deformities.   NEUROLOGICAL: Mental Status: Alert, oriented to person, place and time, Follows commands, and Speech fluent and appropriate. Cranial Nerves: PERRL, no nystagmus, face symmetric, no dysarthria, hearing grossly intact Motor: moves all extremities equally Coordination: finger-nose-finger and heel-to-shin intact bilaterally Gait: normal-based.  IMPRESSION: 67 year old male with a history of COPD,  HTN, HLD, OSA on CPAP who presents for follow up of headaches and vertigo. He did not notice improvement with vestibular therapy or with gabapentin. CTA and vestibular testing are not yet scheduled. Neurological exam today is normal. Discussed treatment options for headache. He will start magnesium at bedtime to help with headaches and sleep. Will also try melatonin before bed to help him fall asleep at night.  PLAN: -Stop gabapentin. Start magnesium 500 mg daily at bedtime for headache prevention -Take melatonin before bedtime to help with sleep -Will follow up CTA neck and vestibular testing results once completed   Follow-up: after testing is completed  I spent a total of 32 minutes on the date of the service. Discussed medication side effects, adverse reactions and drug interactions. Written educational materials and patient instructions outlining all of the above were given.  Genia Harold, MD 09/01/21 2:55 PM

## 2021-09-02 DIAGNOSIS — J4520 Mild intermittent asthma, uncomplicated: Secondary | ICD-10-CM | POA: Diagnosis not present

## 2021-09-02 DIAGNOSIS — G4733 Obstructive sleep apnea (adult) (pediatric): Secondary | ICD-10-CM | POA: Diagnosis not present

## 2021-09-16 ENCOUNTER — Ambulatory Visit: Payer: Medicare HMO | Admitting: Psychiatry

## 2021-09-20 ENCOUNTER — Other Ambulatory Visit: Payer: Self-pay | Admitting: Otolaryngology

## 2021-09-20 DIAGNOSIS — H9113 Presbycusis, bilateral: Secondary | ICD-10-CM

## 2021-09-20 DIAGNOSIS — R42 Dizziness and giddiness: Secondary | ICD-10-CM

## 2021-09-22 ENCOUNTER — Other Ambulatory Visit: Payer: Self-pay | Admitting: Otolaryngology

## 2021-09-22 DIAGNOSIS — R42 Dizziness and giddiness: Secondary | ICD-10-CM

## 2021-09-23 DIAGNOSIS — H26491 Other secondary cataract, right eye: Secondary | ICD-10-CM | POA: Diagnosis not present

## 2021-10-08 DIAGNOSIS — G4733 Obstructive sleep apnea (adult) (pediatric): Secondary | ICD-10-CM | POA: Diagnosis not present

## 2021-10-19 ENCOUNTER — Ambulatory Visit
Admission: RE | Admit: 2021-10-19 | Discharge: 2021-10-19 | Disposition: A | Discharge status: Home / Self Care | Payer: Medicare HMO | Source: Ambulatory Visit | Attending: Otolaryngology | Admitting: Otolaryngology

## 2021-10-19 DIAGNOSIS — R0689 Other abnormalities of breathing: Secondary | ICD-10-CM | POA: Diagnosis not present

## 2021-10-19 DIAGNOSIS — R42 Dizziness and giddiness: Secondary | ICD-10-CM | POA: Diagnosis not present

## 2021-10-19 DIAGNOSIS — I6523 Occlusion and stenosis of bilateral carotid arteries: Secondary | ICD-10-CM | POA: Diagnosis not present

## 2021-10-19 DIAGNOSIS — I672 Cerebral atherosclerosis: Secondary | ICD-10-CM | POA: Diagnosis not present

## 2021-10-19 MED ORDER — IOPAMIDOL (ISOVUE-370) INJECTION 76%
75.0000 mL | Freq: Once | INTRAVENOUS | Status: AC | PRN
  Administered 2021-10-19: 75 mL via INTRAVENOUS

## 2021-11-25 DIAGNOSIS — Z79899 Other long term (current) drug therapy: Secondary | ICD-10-CM | POA: Diagnosis not present

## 2021-11-25 DIAGNOSIS — G4730 Sleep apnea, unspecified: Secondary | ICD-10-CM | POA: Diagnosis not present

## 2021-11-25 DIAGNOSIS — E785 Hyperlipidemia, unspecified: Secondary | ICD-10-CM | POA: Diagnosis not present

## 2021-11-25 DIAGNOSIS — I10 Essential (primary) hypertension: Secondary | ICD-10-CM | POA: Diagnosis not present

## 2021-11-25 DIAGNOSIS — Z125 Encounter for screening for malignant neoplasm of prostate: Secondary | ICD-10-CM | POA: Diagnosis not present

## 2021-12-09 DIAGNOSIS — I10 Essential (primary) hypertension: Secondary | ICD-10-CM | POA: Diagnosis not present

## 2021-12-09 DIAGNOSIS — I700 Atherosclerosis of aorta: Secondary | ICD-10-CM | POA: Diagnosis not present

## 2022-01-17 DIAGNOSIS — H0102B Squamous blepharitis left eye, upper and lower eyelids: Secondary | ICD-10-CM | POA: Diagnosis not present

## 2022-01-17 DIAGNOSIS — H26492 Other secondary cataract, left eye: Secondary | ICD-10-CM | POA: Diagnosis not present

## 2022-01-17 DIAGNOSIS — H0102A Squamous blepharitis right eye, upper and lower eyelids: Secondary | ICD-10-CM | POA: Diagnosis not present

## 2022-01-17 DIAGNOSIS — Z961 Presence of intraocular lens: Secondary | ICD-10-CM | POA: Diagnosis not present

## 2022-01-17 DIAGNOSIS — H43813 Vitreous degeneration, bilateral: Secondary | ICD-10-CM | POA: Diagnosis not present

## 2022-03-21 DIAGNOSIS — M25562 Pain in left knee: Secondary | ICD-10-CM | POA: Diagnosis not present

## 2022-04-11 DIAGNOSIS — J4520 Mild intermittent asthma, uncomplicated: Secondary | ICD-10-CM | POA: Diagnosis not present

## 2022-04-11 DIAGNOSIS — G4733 Obstructive sleep apnea (adult) (pediatric): Secondary | ICD-10-CM | POA: Diagnosis not present

## 2022-04-13 DIAGNOSIS — M25511 Pain in right shoulder: Secondary | ICD-10-CM | POA: Diagnosis not present

## 2022-04-13 DIAGNOSIS — Z96612 Presence of left artificial shoulder joint: Secondary | ICD-10-CM | POA: Diagnosis not present

## 2022-05-05 DIAGNOSIS — M25562 Pain in left knee: Secondary | ICD-10-CM | POA: Diagnosis not present

## 2022-05-10 DIAGNOSIS — M25562 Pain in left knee: Secondary | ICD-10-CM | POA: Diagnosis not present

## 2022-05-13 ENCOUNTER — Ambulatory Visit
Admission: EM | Admit: 2022-05-13 | Discharge: 2022-05-13 | Disposition: A | Discharge status: Home / Self Care | Payer: Medicare HMO | Attending: Family Medicine | Admitting: Family Medicine

## 2022-05-13 DIAGNOSIS — J4489 Other specified chronic obstructive pulmonary disease: Secondary | ICD-10-CM | POA: Diagnosis not present

## 2022-05-13 DIAGNOSIS — Z1152 Encounter for screening for COVID-19: Secondary | ICD-10-CM | POA: Diagnosis not present

## 2022-05-13 DIAGNOSIS — J0100 Acute maxillary sinusitis, unspecified: Secondary | ICD-10-CM | POA: Diagnosis not present

## 2022-05-13 DIAGNOSIS — J069 Acute upper respiratory infection, unspecified: Secondary | ICD-10-CM | POA: Insufficient documentation

## 2022-05-13 LAB — RESP PANEL BY RT-PCR (FLU A&B, COVID) ARPGX2
Influenza A by PCR: NEGATIVE
Influenza B by PCR: NEGATIVE
SARS Coronavirus 2 by RT PCR: NEGATIVE

## 2022-05-13 MED ORDER — AMOXICILLIN-POT CLAVULANATE 875-125 MG PO TABS: 1.0000 | ORAL_TABLET | Freq: Two times a day (BID) | ORAL | 0 refills | Status: DC

## 2022-05-13 MED ORDER — FLUTICASONE PROPIONATE 50 MCG/ACT NA SUSP: 1.0000 | Freq: Two times a day (BID) | NASAL | 2 refills | Status: DC

## 2022-05-13 NOTE — ED Triage Notes (Signed)
I have had an abnormal amount of mucus for the last week or so as well as sneezing. What ever it is I seem to be progressively feeling worse. I have been taking a allergy med for a couple of weeks and stopped that the first of this week.    Pt reports 2 weeks ago symptoms started. Zyzol allergy med taken  but provided no relief. Taken musinex D which helped symptoms a bit. Took a covid test yesterday with a neg result.

## 2022-05-13 NOTE — ED Provider Notes (Signed)
[facility]    CSN: [id number] Arrival date & time: 05/13/22  1107      History   Chief Complaint Chief Complaint  Patient presents with   Nasal Congestion    I have had an abnormal amount of mucus for the last week or so as well as sneezing. What ever it is I seem to be progressively feeling worse. I have been taking a allergy med for a couple of weeks and stopped that the first of this week. - Entered by patient    HPI Ryan [name] is a 66 y.o. male.   Presenting today with several week history of sneezing, sinus drainage and now the past few days having copious thick mucus, sinus pressure, headache, chills, body aches.  Denies cough, chest pain, shortness of breath, abdominal pain, nausea vomiting or diarrhea.  Was taking an allergy medication daily up until earlier this week, now is taking Mucinex D with mild temporary leaf of symptoms.  Took a home COVID test yesterday which was negative.  History of COPD, asthma, seasonal allergies.  No known sick contacts recently.    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   Mucosal abnormality of stomach    AVM (arteriovenous malformation)    History of colonic polyps    Diverticulosis of colon without hemorrhage    IDA (iron deficiency anemia) 08/08/2014   HYPERTENSION 01/04/2009   ALLERGIC RHINITIS 01/04/2009   OBSTRUCTIVE SLEEP APNEA 01/01/2009    Past Surgical History:  Procedure Laterality Date   CATARACT EXTRACTION  2018   CATARACT EXTRACTION W/PHACO Right 02/23/2015   Procedure: CATARACT EXTRACTION PHACO AND INTRAOCULAR  LENS PLACEMENT ([place]);  Surgeon: Williams Che, MD;  Location: [facility];  Service: Ophthalmology;  Laterality: Right;  CDE:5.02   CHOLECYSTECTOMY     COLONOSCOPY  03/15/2005   Dr. Rourk:Rectal polyps (diminutive), status post cold biopsy/removal/normal colon. hyperplastic   COLONOSCOPY N/A 08/27/2014   Dr. Gala Romney: Multiple rectal and colonic polyps removed, one which was a 1 cm sessile polyp. Cecal AVMS ablated as described above. colonic Diverticulosis. Path with rectal hyperplastic polyp, ascending colon polyps with benign colonic mucosa, sessile serrated polyp with associated lipoma. Recommendation for surveillance colonoscopy in 3 years.    COLONOSCOPY WITH PROPOFOL N/A 12/25/2017   Procedure: COLONOSCOPY WITH PROPOFOL;  Surgeon: Daneil Dolin, MD;  [facility];  Service: Endoscopy;  Laterality: N/A;  1:45pm   ESOPHAGOGASTRODUODENOSCOPY N/A 08/27/2014   Dr. Rourk:Tiny distal esophageal erosions consistent with mild erosive reflux esophaigitis. Hiatal hernia. Antral erosions status post gastric biopsy. mild chronic gastritis, negative H.pylori   GIVENS CAPSULE STUDY N/A 11/05/2014   Procedure: GIVENS CAPSULE STUDY;  Surgeon: Daneil Dolin, MD;  [facility];  Service: Endoscopy;  Laterality: N/A;  0800   KNEE ARTHROSCOPY Right    POLYPECTOMY  12/25/2017   Procedure: POLYPECTOMY;  Surgeon: Daneil Dolin, MD;  [facility];  Service: Endoscopy;;   TOTAL SHOULDER ARTHROPLASTY Left 02/28/2019  Procedure: TOTAL SHOULDER ARTHROPLASTY;  Surgeon: Justice Britain, MD;  Location: [facility];  Service: Orthopedics;  Laterality: Left;  138mn       Home Medications    Prior to Admission medications   Medication Sig Start Date End Date Taking? Authorizing Provider  amoxicillin-clavulanate (AUGMENTIN) 875-125 MG tablet Take 1 tablet by mouth every 12 (twelve) hours. 05/13/22  Yes LVolney American PA-C  fluticasone ([facility] 50 MCG/ACT nasal spray Place 1 spray into  both nostrils 2 (two) times daily. 05/13/22  Yes LVolney American PA-C  fluticasone-salmeterol (ADVAIR [facility] [phone]/ACT inhaler Inhale 2 puffs into the lungs 2 (two) times daily.  09/14/10   [provider]  gabapentin (NEURONTIN) 300 MG capsule Take 1 capsule (300 mg total) by mouth 3 (three) times daily. 06/10/21   CGenia Harold MD  lansoprazole (PREVACID) 15 MG capsule Take 15 mg by mouth daily at 12 noon.    [provider]  levalbuterol (Penne LashHFA) 45 MCG/ACT inhaler Inhale into the lungs every 4 (four) hours as needed for wheezing.    [provider]  meclizine (ANTIVERT) 25 MG tablet Take 1 tablet (25 mg total) by mouth 3 (three) times daily as needed for dizziness. 06/10/21   CGenia Harold MD  mirtazapine (REMERON) 15 MG tablet Take 15 mg by mouth at bedtime.    [provider]  rosuvastatin (CRESTOR) 20 MG tablet Take by mouth. 04/27/21   [provider]  valsartan (DIOVAN) 160 MG tablet Take 160 mg by mouth daily.    [provider]  zolpidem (AMBIEN) 10 MG tablet Take 10 mg by mouth at bedtime as needed for sleep.  03/09/10   [provider]    Family History Family History  Problem Relation Age of Onset   Cancer Mother        unsure primary   Cancer Father        prostate   Colon cancer Neg Hx     Social History Social History   Tobacco Use   Smoking status: Former    Packs/day: 2.00    Years: 35.00    Total pack years: 70.00    Types: Cigarettes    Quit date: 10/15/2008    Years since quitting: 13.5   Smokeless tobacco: Never  Vaping Use   Vaping Use: Never used  Substance Use Topics   Alcohol use: Yes    Alcohol/week: 2.0 standard drinks of alcohol    Types: 2 Shots of liquor per week    Comment: couple of drinks of [place] every night   Drug use: No     Allergies   Latex and Tiotropium bromide monohydrate   Review of Systems Review of Systems Per HPI  Physical Exam Triage  Vital Signs ED Triage Vitals  Enc Vitals Group     BP 05/13/22 1216 (!) 151/87     Pulse Rate 05/13/22 1216 78     Resp 05/13/22 1216 16     Temp 05/13/22 1216 98 F (36.7 C)     Temp Source 05/13/22 1216 Oral     SpO2 05/13/22 1216 91 %     Weight --      Height --      Head Circumference --      Peak Flow --      Pain Score 05/13/22 1221 0     Pain Loc --      Pain Edu? --      [place]  [place] found.  Updated Vital Signs BP (!) 151/87 (BP Location: Right Arm)   Pulse 78   Temp 98 F (36.7 C) (Oral)   Resp 16   SpO2 91%   Visual Acuity Right Eye Distance:   Left Eye Distance:   Bilateral Distance:    Right Eye Near:   Left Eye Near:    Bilateral Near:     Physical Exam Vitals and nursing note reviewed.  Constitutional:      Appearance: He is well-developed.  HENT:     Head: Atraumatic.     Right Ear: External ear normal.     Left Ear: External ear normal.     Nose: Congestion present.     Mouth/Throat:     Pharynx: Posterior oropharyngeal erythema present. No oropharyngeal exudate.  Eyes:     Conjunctiva/sclera: Conjunctivae normal.     Pupils: Pupils are equal, round, and reactive to light.  Cardiovascular:     Rate and Rhythm: Normal rate and regular rhythm.  Pulmonary:     Effort: Pulmonary effort is normal. No respiratory distress.     Breath sounds: No wheezing or rales.  Musculoskeletal:        General: Normal range of motion.     Cervical back: Normal range of motion and neck supple.  Lymphadenopathy:     Cervical: No cervical adenopathy.  Skin:    General: Skin is warm and dry.  Neurological:     Mental Status: He is alert and oriented to person, place, and time.     Motor: No weakness.     Gait: Gait normal.  Psychiatric:        Behavior: Behavior normal.      UC Treatments / Results  Labs (all labs ordered are listed, but only abnormal results are displayed) Labs Reviewed  RESP PANEL BY RT-PCR (FLU A&B, COVID) [id number]     EKG   Radiology No results found.  Procedures Procedures (including critical care time)  Medications Ordered in UC Medications - No data to display  Initial Impression / Assessment and Plan / UC Course  I have reviewed the triage vital signs and the nursing notes.  Pertinent labs & imaging results that were available during my care of the patient were reviewed by me and considered in my medical decision making (see chart for details).     Possibly developing sinusitis from several weeks of ongoing congestion, allergy symptoms so will cover with Augmentin if not resolving over the next few days.  Start nasal sprays twice daily, sinus rinses and continue decongestants and Mucinex to see if this resolves symptoms alone.  We will also check for possible new viral illness with respiratory panel.  Follow-up for worsening symptoms.  Final Clinical Impressions(s) / UC Diagnoses   Final diagnoses:  Viral URI  Acute maxillary sinusitis, recurrence not specified     Discharge Instructions      I have sent over an antibiotic in case her symptoms continue to worsen over the next few days.  Prior to trying this, try Flonase twice daily, sinus rinses with saline several times daily, continued allergy and decongestant regimen.  We have sent out for COVID and flu testing and should have those results in the morning.  We will discuss antiviral therapy if either of those are positive.    ED Prescriptions     Medication Sig Dispense Auth. Provider   amoxicillin-clavulanate (AUGMENTIN) 875-125 MG tablet Take 1 tablet by mouth every 12 (twelve)  hours. 14 tablet Volney American, PA-C   fluticasone [facility]) 50 MCG/ACT nasal spray Place 1 spray into both nostrils 2 (two) times daily. 16 g Volney American, [place]      PDMP not reviewed this encounter.   Volney American, [place] 05/13/22 1344

## 2022-05-13 NOTE — Discharge Instructions (Signed)
I have sent over an antibiotic in case her symptoms continue to worsen over the next few days.  Prior to trying this, try Flonase twice daily, sinus rinses with saline several times daily, continued allergy and decongestant regimen.  We have sent out for COVID and flu testing and should have those results in the morning.  We will discuss antiviral therapy if either of those are positive.

## 2022-05-20 DIAGNOSIS — M5416 Radiculopathy, lumbar region: Secondary | ICD-10-CM | POA: Diagnosis not present

## 2022-05-20 DIAGNOSIS — M47896 Other spondylosis, lumbar region: Secondary | ICD-10-CM | POA: Diagnosis not present

## 2022-05-20 DIAGNOSIS — I714 Abdominal aortic aneurysm, without rupture, unspecified: Secondary | ICD-10-CM | POA: Diagnosis not present

## 2022-05-26 DIAGNOSIS — I700 Atherosclerosis of aorta: Secondary | ICD-10-CM | POA: Diagnosis not present

## 2022-05-26 DIAGNOSIS — Z23 Encounter for immunization: Secondary | ICD-10-CM | POA: Diagnosis not present

## 2022-05-26 DIAGNOSIS — I10 Essential (primary) hypertension: Secondary | ICD-10-CM | POA: Diagnosis not present

## 2022-05-27 ENCOUNTER — Other Ambulatory Visit: Payer: Self-pay | Admitting: Internal Medicine

## 2022-05-27 DIAGNOSIS — I700 Atherosclerosis of aorta: Secondary | ICD-10-CM

## 2022-05-31 DIAGNOSIS — R0602 Shortness of breath: Secondary | ICD-10-CM | POA: Diagnosis not present

## 2022-05-31 DIAGNOSIS — I10 Essential (primary) hypertension: Secondary | ICD-10-CM | POA: Diagnosis not present

## 2022-05-31 DIAGNOSIS — J439 Emphysema, unspecified: Secondary | ICD-10-CM | POA: Diagnosis not present

## 2022-05-31 DIAGNOSIS — G4730 Sleep apnea, unspecified: Secondary | ICD-10-CM | POA: Diagnosis not present

## 2022-05-31 DIAGNOSIS — I714 Abdominal aortic aneurysm, without rupture, unspecified: Secondary | ICD-10-CM | POA: Diagnosis not present

## 2022-05-31 DIAGNOSIS — G4733 Obstructive sleep apnea (adult) (pediatric): Secondary | ICD-10-CM | POA: Diagnosis not present

## 2022-05-31 DIAGNOSIS — Z87891 Personal history of nicotine dependence: Secondary | ICD-10-CM | POA: Diagnosis not present

## 2022-06-06 ENCOUNTER — Ambulatory Visit (HOSPITAL_COMMUNITY)
Admission: RE | Admit: 2022-06-06 | Discharge: 2022-06-06 | Disposition: A | Discharge status: Home / Self Care | Payer: Medicare HMO | Source: Ambulatory Visit | Attending: Internal Medicine | Admitting: Internal Medicine

## 2022-06-06 DIAGNOSIS — I700 Atherosclerosis of aorta: Secondary | ICD-10-CM | POA: Diagnosis not present

## 2022-06-06 DIAGNOSIS — Z136 Encounter for screening for cardiovascular disorders: Secondary | ICD-10-CM | POA: Diagnosis not present

## 2022-06-06 DIAGNOSIS — I714 Abdominal aortic aneurysm, without rupture, unspecified: Secondary | ICD-10-CM | POA: Diagnosis not present

## 2022-06-06 DIAGNOSIS — I10 Essential (primary) hypertension: Secondary | ICD-10-CM | POA: Diagnosis not present

## 2022-06-07 DIAGNOSIS — M5416 Radiculopathy, lumbar region: Secondary | ICD-10-CM | POA: Diagnosis not present

## 2022-06-07 DIAGNOSIS — M5451 Vertebrogenic low back pain: Secondary | ICD-10-CM | POA: Diagnosis not present

## 2022-06-09 DIAGNOSIS — G4733 Obstructive sleep apnea (adult) (pediatric): Secondary | ICD-10-CM | POA: Diagnosis not present

## 2022-06-27 DIAGNOSIS — B9789 Other viral agents as the cause of diseases classified elsewhere: Secondary | ICD-10-CM | POA: Diagnosis not present

## 2022-06-27 DIAGNOSIS — Z20822 Contact with and (suspected) exposure to covid-19: Secondary | ICD-10-CM | POA: Diagnosis not present

## 2022-06-27 DIAGNOSIS — J019 Acute sinusitis, unspecified: Secondary | ICD-10-CM | POA: Diagnosis not present

## 2022-07-06 DIAGNOSIS — M5451 Vertebrogenic low back pain: Secondary | ICD-10-CM | POA: Diagnosis not present

## 2022-07-06 DIAGNOSIS — M5416 Radiculopathy, lumbar region: Secondary | ICD-10-CM | POA: Diagnosis not present

## 2022-07-07 DIAGNOSIS — J0100 Acute maxillary sinusitis, unspecified: Secondary | ICD-10-CM | POA: Diagnosis not present

## 2022-08-22 ENCOUNTER — Ambulatory Visit
Admission: EM | Admit: 2022-08-22 | Discharge: 2022-08-22 | Disposition: A | Discharge status: Home / Self Care | Payer: Medicare HMO | Attending: Nurse Practitioner | Admitting: Nurse Practitioner

## 2022-08-22 DIAGNOSIS — U071 COVID-19: Secondary | ICD-10-CM | POA: Diagnosis not present

## 2022-08-22 MED ORDER — MOLNUPIRAVIR EUA 200MG CAPSULE: 4.0000 | ORAL_CAPSULE | Freq: Two times a day (BID) | ORAL | 0 refills | Status: AC

## 2022-08-22 MED ORDER — FLUTICASONE PROPIONATE 50 MCG/ACT NA SUSP: 2.0000 | Freq: Every day | NASAL | 0 refills | Status: DC

## 2022-08-22 NOTE — ED Triage Notes (Signed)
Pt reports congestion and sore throat stated today. Reports wife has COVID and he had a positive home COVID test today that was expired in 2022.

## 2022-08-22 NOTE — ED Provider Notes (Signed)
[facility]    CSN: [id number] Arrival date & time: 08/22/22  1347      History   Chief Complaint Chief Complaint  Patient presents with   Sore Throat    My wife was in earlier [facility] and has tested positive for Covid and I just took a test with a 2022 exp date and it showed very faint second line. - Entered by patient    HPI Ryan [name] is a 67 y.o. male.   The history is provided by the patient.   The patient presents for complaints of congestion and sore throat that started today.  Patient denies fever, chills, headache, ear pain, cough, abdominal pain, nausea, vomiting, or diarrhea.  Patient reports that his wife tested positive for COVID and he tested positive with a home test today.  Patient states that he had a "faint" red line on the test result.  Patient was concerned that the test had expired in 2022.  Patient has received the COVID-vaccine, last dose was in 2021.  Patient reports he has not taken any medication for his symptoms.  Patient has a history of COPD, asthma, hypertension, and hyperlipidemia.  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   Mucosal abnormality of stomach    AVM (arteriovenous malformation)    History of colonic polyps    Diverticulosis of colon without hemorrhage    IDA (iron deficiency anemia) 08/08/2014   HYPERTENSION 01/04/2009   ALLERGIC RHINITIS 01/04/2009   OBSTRUCTIVE SLEEP APNEA 01/01/2009    Past Surgical History:  Procedure Laterality Date   CATARACT EXTRACTION  2018   CATARACT EXTRACTION W/PHACO Right 02/23/2015    Procedure: CATARACT EXTRACTION PHACO AND INTRAOCULAR LENS PLACEMENT ([place]);  Surgeon: Williams Che, MD;  Location: [facility];  Service: Ophthalmology;  Laterality: Right;  CDE:5.02   CHOLECYSTECTOMY     COLONOSCOPY  03/15/2005   Dr. Rourk:Rectal polyps (diminutive), status post cold biopsy/removal/normal colon. hyperplastic   COLONOSCOPY N/A 08/27/2014   Dr. Gala Romney: Multiple rectal and colonic polyps removed, one which was a 1 cm sessile polyp. Cecal AVMS ablated as described above. colonic Diverticulosis. Path with rectal hyperplastic polyp, ascending colon polyps with benign colonic mucosa, sessile serrated polyp with associated lipoma. Recommendation for surveillance colonoscopy in 3 years.    COLONOSCOPY WITH PROPOFOL N/A 12/25/2017   Procedure: COLONOSCOPY WITH PROPOFOL;  Surgeon: Daneil Dolin, MD;  Location: [facility];  Service: Endoscopy;  Laterality: N/A;  1:45pm   ESOPHAGOGASTRODUODENOSCOPY N/A 08/27/2014   Dr. Rourk:Tiny distal esophageal erosions consistent with mild erosive reflux esophaigitis. Hiatal hernia. Antral erosions status post gastric biopsy. mild chronic gastritis, negative H.pylori   GIVENS CAPSULE STUDY N/A 11/05/2014   Procedure: GIVENS CAPSULE STUDY;  Surgeon: Daneil Dolin, MD;  Location: [facility];  Service: Endoscopy;  Laterality: N/A;  0800   KNEE ARTHROSCOPY Right    POLYPECTOMY  12/25/2017   Procedure: POLYPECTOMY;  Surgeon: Daneil Dolin, MD;  Location: [facility];  Service: Endoscopy;;  TOTAL SHOULDER ARTHROPLASTY Left 02/28/2019   Procedure: TOTAL SHOULDER ARTHROPLASTY;  Surgeon: Justice Britain, MD;  Location: [facility];  Service: Orthopedics;  Laterality: Left;  143mn       Home Medications    Prior to Admission medications   Medication Sig Start Date End Date Taking? Authorizing Provider  fluticasone (FLONASE) 50 MCG/ACT nasal spray Place 2 sprays into both nostrils daily. 08/22/22  Yes Nicko Daher-Warren, CAlda Lea NP  molnupiravir EUA (LAGEVRIO)  200 mg CAPS capsule Take 4 capsules (800 mg total) by mouth 2 (two) times daily for 5 days. 08/22/22 08/27/22 Yes Ottavio Norem-Warren, CAlda Lea NP  amoxicillin-clavulanate (AUGMENTIN) 875-125 MG tablet Take 1 tablet by mouth every 12 (twelve) hours. 05/13/22   LVolney American PA-C  fluticasone-salmeterol (ADVAIR HFA) [phone]/ACT inhaler Inhale 2 puffs into the lungs 2 (two) times daily.  09/14/10   [provider]  gabapentin (NEURONTIN) 300 MG capsule Take 1 capsule (300 mg total) by mouth 3 (three) times daily. 06/10/21   CGenia Harold MD  lansoprazole (PREVACID) 15 MG capsule Take 15 mg by mouth daily at 12 noon.    [provider]  levalbuterol (Penne LashHFA) 45 MCG/ACT inhaler Inhale into the lungs every 4 (four) hours as needed for wheezing.    [provider]  meclizine (ANTIVERT) 25 MG tablet Take 1 tablet (25 mg total) by mouth 3 (three) times daily as needed for dizziness. 06/10/21   CGenia Harold MD  mirtazapine (REMERON) 15 MG tablet Take 15 mg by mouth at bedtime.    [provider]  rosuvastatin (CRESTOR) 20 MG tablet Take by mouth. 04/27/21   [provider]  valsartan (DIOVAN) 160 MG tablet Take 160 mg by mouth daily.    [provider]  zolpidem (AMBIEN) 10 MG tablet Take 10 mg by mouth at bedtime as needed for sleep.  03/09/10   [provider]    Family History Family History  Problem Relation Age of Onset   Cancer Mother        unsure primary   Cancer Father        prostate   Colon cancer Neg Hx     Social History Social History   Tobacco Use   Smoking status: Former    Packs/day: 2.00    Years: 35.00    Total pack years: 70.00    Types: Cigarettes    Quit date: 10/15/2008    Years since quitting: 13.8   Smokeless tobacco: Never  Vaping Use   Vaping Use: Never used  Substance Use Topics   Alcohol use: Yes    Alcohol/week: 2.0 standard drinks of alcohol    Types: 2 Shots of liquor per week     Comment: couple of drinks of [place] every night   Drug use: No     Allergies   Latex and Tiotropium bromide monohydrate   Review of Systems Review of Systems Per HPI  Physical Exam Triage Vital Signs ED Triage Vitals  Enc Vitals Group     BP 08/22/22 1512 (!) 157/92     Pulse --      Resp 08/22/22 1512 18     Temp 08/22/22 1512 97.8 F (36.6 C)     Temp Source 08/22/22 1512 Oral     SpO2 08/22/22 1512 93 %     Weight --      Height --      Head Circumference --      Peak Flow --  Pain Score 08/22/22 1511 3     Pain Loc --      Pain Edu? --      Excl. in [place]? --    No data found.  Updated Vital Signs BP (!) 157/92 (BP Location: Right Arm)   Temp 97.8 F (36.6 C) (Oral)   Resp 18   SpO2 93%   Visual Acuity Right Eye Distance:   Left Eye Distance:   Bilateral Distance:    Right Eye Near:   Left Eye Near:    Bilateral Near:     Physical Exam Vitals and nursing note reviewed.  Constitutional:      Appearance: He is well-developed. He is not toxic-appearing.  HENT:     Head: Normocephalic.     Right Ear: Tympanic membrane and ear canal normal.     Left Ear: Tympanic membrane and ear canal normal.     Nose: Congestion present. No rhinorrhea.     Mouth/Throat:     Mouth: No oral lesions.     Pharynx: Uvula midline. Pharyngeal swelling and posterior oropharyngeal erythema present. No oropharyngeal exudate or uvula swelling.     Tonsils: No tonsillar exudate. 1+ on the right. 1+ on the left.  Eyes:     Conjunctiva/sclera: Conjunctivae normal.     Pupils: Pupils are equal, round, and reactive to light.  Cardiovascular:     Rate and Rhythm: Normal rate and regular rhythm.     Heart sounds: Normal heart sounds.  Pulmonary:     Effort: Pulmonary effort is normal.     Breath sounds: Normal breath sounds.  Abdominal:     General: Bowel sounds are normal.     Palpations: Abdomen is soft.     Tenderness: There is no abdominal tenderness.   Musculoskeletal:     Cervical back: Normal range of motion.  Lymphadenopathy:     Cervical: No cervical adenopathy.  Skin:    General: Skin is warm and dry.  Neurological:     General: No focal deficit present.     Mental Status: He is alert and oriented to person, place, and time.  Psychiatric:        Mood and Affect: Mood normal.        Behavior: Behavior normal.      UC Treatments / Results  Labs (all labs ordered are listed, but only abnormal results are displayed) Labs Reviewed - No data to display  EKG   Radiology No results found.  Procedures Procedures (including critical care time)  Medications Ordered in UC Medications - No data to display  Initial Impression / Assessment and Plan / UC Course  I have reviewed the triage vital signs and the nursing notes.  Pertinent labs & imaging results that were available during my care of the patient were reviewed by me and considered in my medical decision making (see chart for details).  The the patient is well-appearing, he is in no acute distress, vital signs are stable.  Patient with positive self-administered home COVID test.  Given patient's history, will treat with molnupiravir for antiviral therapy.  For his nasal congestion, fluticasone 50 mcg nasal spray was prescribed.  Supportive care recommendations were provided to the patient to include increasing fluids, allowing for plenty of rest, and warm salt water gargles as needed.  Patient was advised that symptoms may worsen before they begin to get better.  Patient was concerned if he needed to come back.  Patient was advised to contact the office.  Discussed isolation precautions with the patient and when he can integrate back into his normal routine.  Also provided the patient with strict ER precautions.  Patient verbalizes understanding, and is in agreement with this plan of care.  All questions were answered.  Patient stable for discharge. Final Clinical  Impressions(s) / UC Diagnoses   Final diagnoses:  Positive self-administered antigen test for COVID-19  COVID     Discharge Instructions      Take medication as prescribed.   Increase fluids and allow for plenty of rest. Recommend over-the-counter Tylenol as needed for pain, fever, or general discomfort. Warm salt water gargles 3-4 times daily to help with throat pain or discomfort. Recommend using a humidifier in your bedroom at nighttime during sleep and sleeping elevated on pillows if your develop a cough. Recommend warm salt water gargles 3-4 times daily as needed for throat pain or discomfort. As discussed, your treatment has been prescribed for 5 days.  While you are on the medication, you will need to remain isolated.  Once you complete the treatment, if you are symptom-free, you can return to your normal activities.  If you continue to have symptoms, you will need to wear your mask for an additional 5 days. Go to the emergency department if you experience shortness of breath, difficulty breathing, or other concerns. Follow-up needed.     ED Prescriptions     Medication Sig Dispense Auth. Provider   molnupiravir EUA (LAGEVRIO) 200 mg CAPS capsule Take 4 capsules (800 mg total) by mouth 2 (two) times daily for 5 days. 40 capsule Messi Twedt-Warren, Alda Lea, NP   fluticasone (FLONASE) 50 MCG/ACT nasal spray Place 2 sprays into both nostrils daily. 16 g Zhara Gieske-Warren, Alda Lea, NP      PDMP not reviewed this encounter.   Tish Men, NP 08/22/22 1539

## 2022-08-22 NOTE — Discharge Instructions (Addendum)
Take medication as prescribed.   Increase fluids and allow for plenty of rest. Recommend over-the-counter Tylenol as needed for pain, fever, or general discomfort. Warm salt water gargles 3-4 times daily to help with throat pain or discomfort. Recommend using a humidifier in your bedroom at nighttime during sleep and sleeping elevated on pillows if your develop a cough. Recommend warm salt water gargles 3-4 times daily as needed for throat pain or discomfort. As discussed, your treatment has been prescribed for 5 days.  While you are on the medication, you will need to remain isolated.  Once you complete the treatment, if you are symptom-free, you can return to your normal activities.  If you continue to have symptoms, you will need to wear your mask for an additional 5 days. Go to the emergency department if you experience shortness of breath, difficulty breathing, or other concerns. Follow-up needed.

## 2022-09-06 DIAGNOSIS — U071 COVID-19: Secondary | ICD-10-CM | POA: Diagnosis not present

## 2022-09-06 DIAGNOSIS — I10 Essential (primary) hypertension: Secondary | ICD-10-CM | POA: Diagnosis not present

## 2022-09-09 IMAGING — MR MR HEAD W/O CM
12 series · 48 of 48 positions shown · non-contrast
Comparison: Head CT 06/18/2010.

CLINICAL DATA: 65-year-old male with 3 months of dizziness,
vertigo. Postural dizziness. No known injury.

EXAM:
MRI HEAD WITHOUT CONTRAST
TECHNIQUE: Multiplanar, multiecho pulse sequences of the brain and surrounding
structures were obtained without intravenous contrast.

[Series 5: DWI · axial · 3.0mm · 0.77mm/px · z∈[-53,+94]mm · 3 of 50 slices shown (1 of 4)]
[im 1/50]
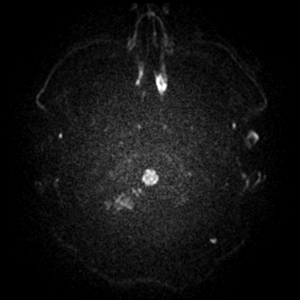
[im 25/50]
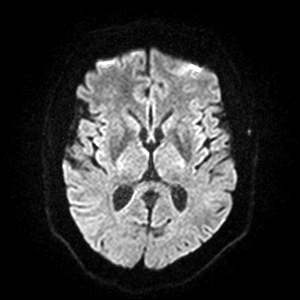
[im 50/50]
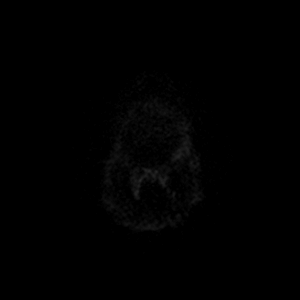

[Series 6: DWI · axial · 3.0mm · 0.77mm/px · z∈[-53,+94]mm · 4 of 50 slices shown (2 of 4)]
[im 1/50]
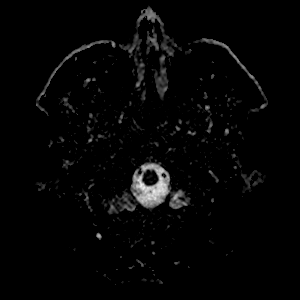
[im 17/50]
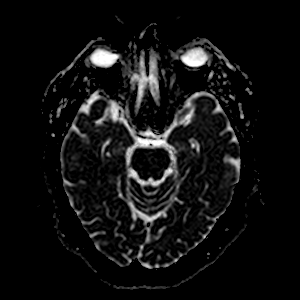
[im 33/50]
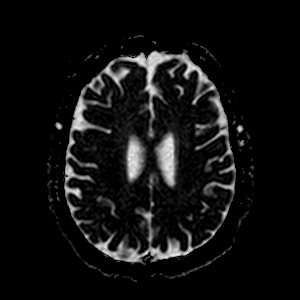
[im 50/50]
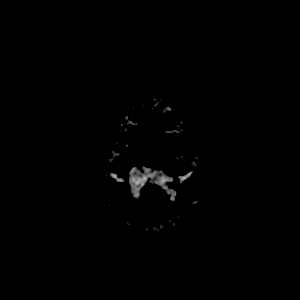

[Series 7: DWI · coronal · 5.0mm · 0.88mm/px · 2 of 28 slices shown (3 of 4)]
[im 1/28]
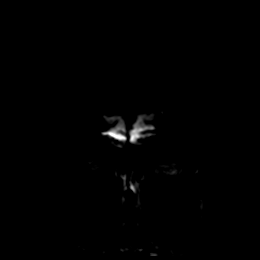
[im 28/28]
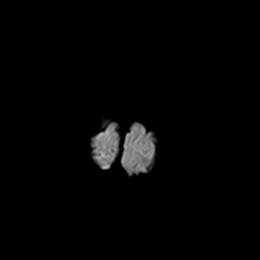

[Series 8: DWI · coronal · 5.0mm · 0.88mm/px · 2 of 28 slices shown (4 of 4)]
[im 1/28]
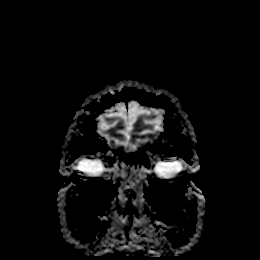
[im 28/28]
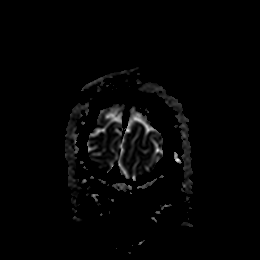

[Series 9: T1 · sagittal · 5.0mm · 0.75mm/px · 2 of 21 slices shown (1 of 2)]
[im 1/21]
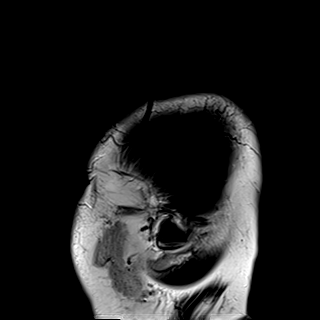
[im 21/21]
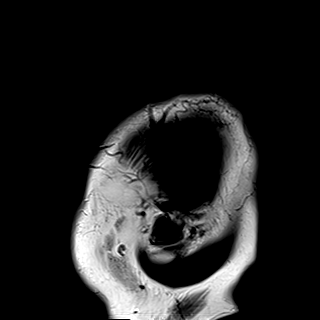

[Series 10: T2 · axial · 5.0mm · 0.72mm/px · z∈[-56,+97]mm · 2 of 23 slices shown (1 of 2)]
[im 1/23]
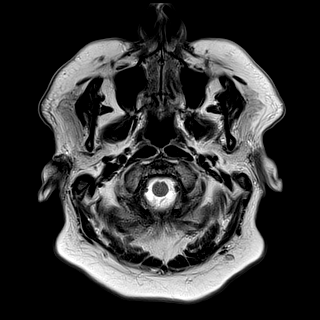
[im 23/23]
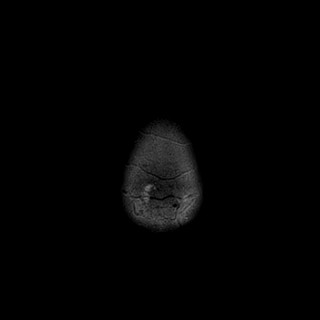

[Series 11: mag_images · axial · 3.0mm · 0.90mm/px · z∈[-66,+110]mm · 5 of 60 slices shown]
[im 1/60]
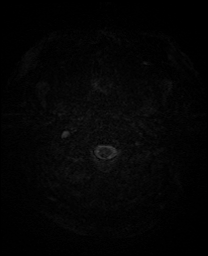
[im 15/60]
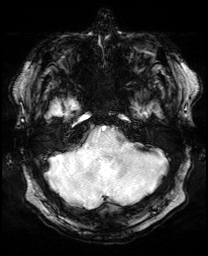
[im 30/60]
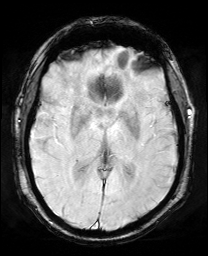
[im 45/60]
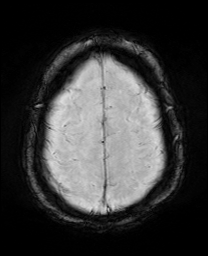
[im 60/60]
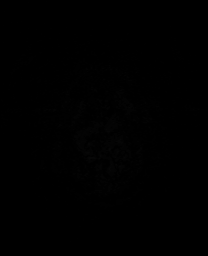

[Series 12: pha_images · axial · 3.0mm · 0.90mm/px · z∈[-63,+110]mm · 4 of 59 slices shown]
[im 1/59]
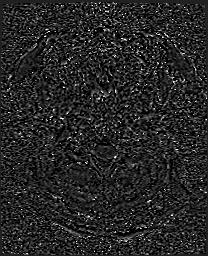
[im 20/59]
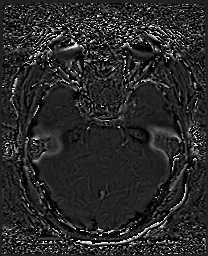
[im 39/59]
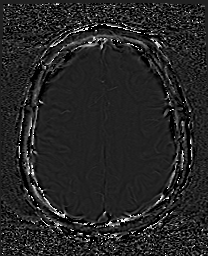
[im 59/59]
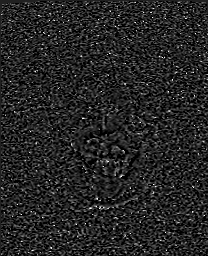

[Series 13: swi_images · axial · 3.0mm · 0.90mm/px · z∈[-66,+110]mm · 5 of 60 slices shown]
[im 1/60]
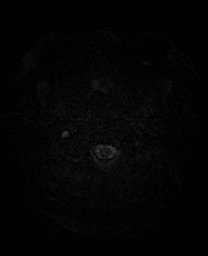
[im 15/60]
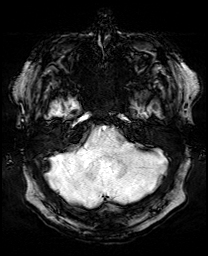
[im 30/60]
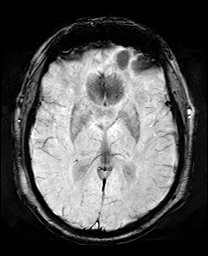
[im 45/60]
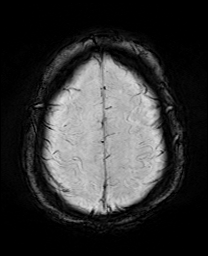
[im 60/60]
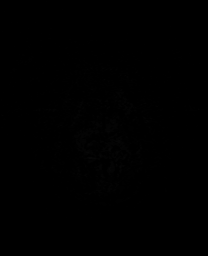

[Series 15: FLAIR · axial · 3.0mm · 0.45mm/px · z∈[-56,+99]mm · 4 of 53 slices shown]
[im 1/53]
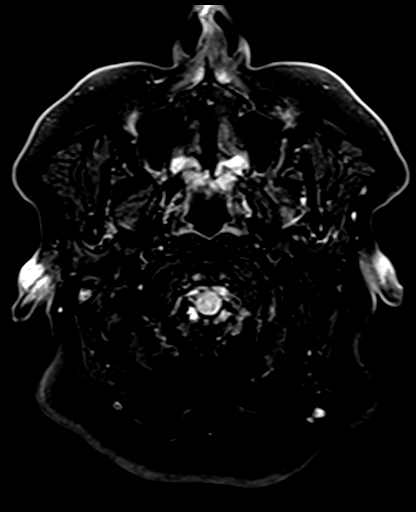
[im 18/53]
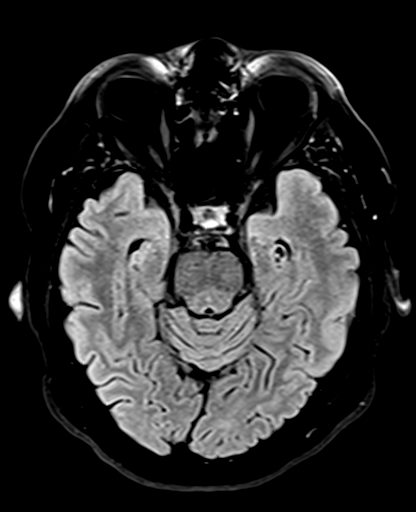
[im 35/53]
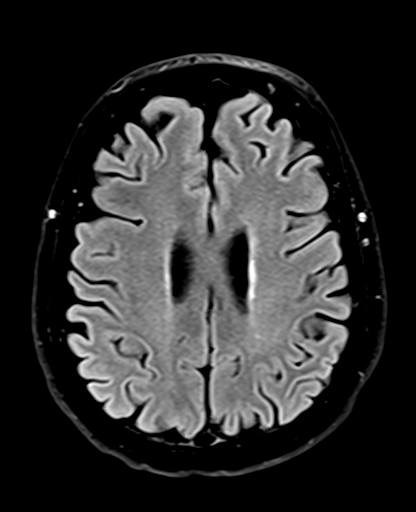
[im 53/53]
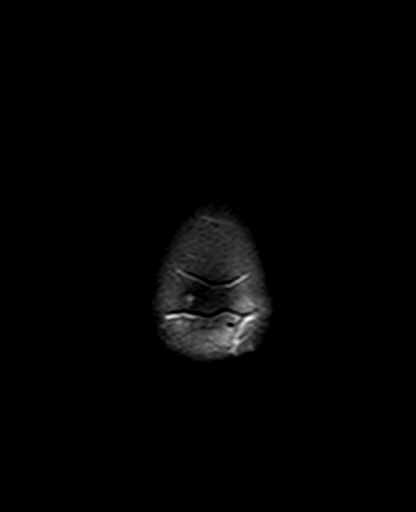

[Series 16: T1 · axial · 1.0mm · 0.98mm/px · z∈[-67,+105]mm · 13 of 174 slices shown (2 of 2)]
[im 1/174]
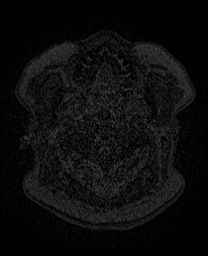
[im 15/174]
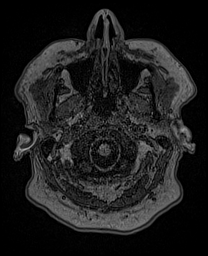
[im 29/174]
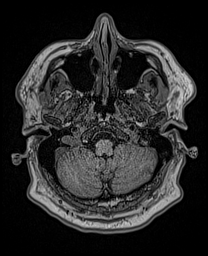
[im 44/174]
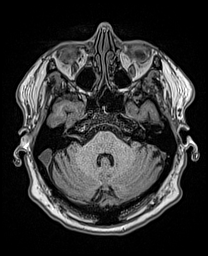
[im 58/174]
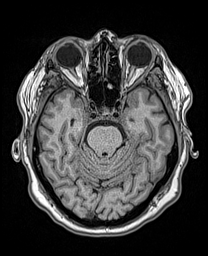
[im 73/174]
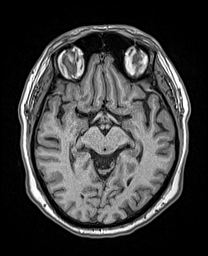
[im 87/174]
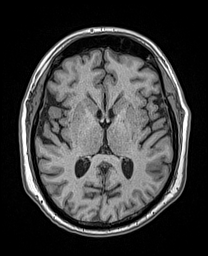
[im 101/174]
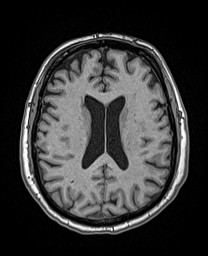
[im 116/174]
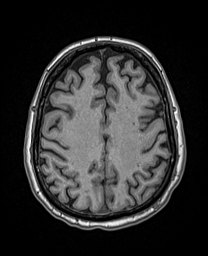
[im 130/174]
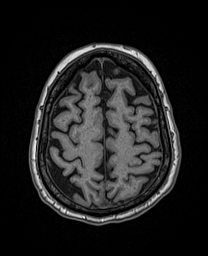
[im 145/174]
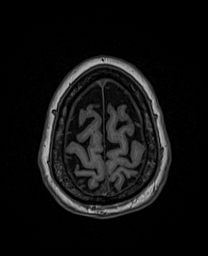
[im 159/174]
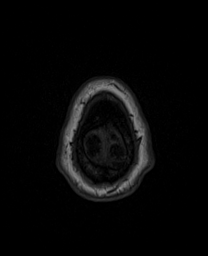
[im 174/174]
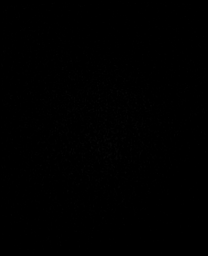

[Series 17: T2 · coronal · 5.0mm · 0.72mm/px · 2 of 28 slices shown (2 of 2)]
[im 1/28]
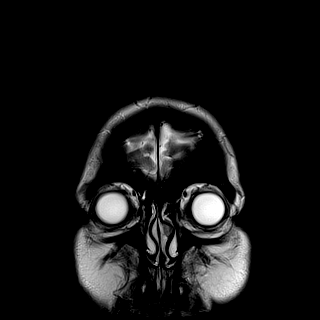
[im 28/28]
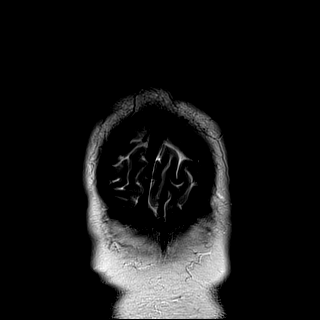

[48 of 48 positions shown; findings below may reference images not displayed]

FINDINGS: Brain: Cerebral volume seems within normal limits for age. No
restricted diffusion to suggest acute infarction. No midline shift,
mass effect, evidence of mass lesion, ventriculomegaly, extra-axial
collection or acute intracranial hemorrhage. Cervicomedullary
junction and pituitary are within normal limits.

Minimal to mild for age scattered small cerebral white matter T2 and
FLAIR hyperintense foci in a nonspecific configuration. No cortical
encephalomalacia. No chronic cerebral blood products. Deep gray
matter nuclei, brainstem and cerebellum appear normal.

Vascular: Major intracranial vascular flow voids are preserved.

Skull and upper cervical spine: Negative visible cervical spine.
Visualized bone marrow signal is within normal limits.

Sinuses/Orbits: Postoperative changes to both globes. Otherwise
negative orbits. Trace ethmoid sinus mucosal thickening. Other
paranasal sinuses and mastoids are clear.

Other: Visible internal auditory structures appear normal. Normal
stylomastoid foramina. Negative visible scalp and face.
IMPRESSION: No acute intracranial abnormality. Essentially normal for age
noncontrast MRI appearance of the brain.

## 2022-11-22 ENCOUNTER — Encounter: Payer: Self-pay | Admitting: *Deleted

## 2022-12-01 ENCOUNTER — Telehealth: Payer: Self-pay | Admitting: *Deleted

## 2022-12-01 NOTE — Telephone Encounter (Signed)
  Procedure: Colonoscopy  Height: 5'10 Weight: 239lbs        Have you had a colonoscopy before?  12/25/17, Dr. Jena Gauss  Do you have family history of colon cancer?  Yes, nephew  Do you have a family history of polyps? ?  Previous colonoscopy with polyps removed? yes  Do you have a history colorectal cancer?   no  Are you diabetic?  no  Do you have a prosthetic or mechanical heart valve? no  Do you have a pacemaker/defibrillator?   no  Have you had endocarditis/atrial fibrillation?  no  Do you use supplemental oxygen/CPAP?  yes cpap  Have you had joint replacement within the last 12 months?  No, left shoulder surgery 2020  Do you tend to be constipated or have to use laxatives?  no   Do you have history of alcohol use? If yes, how much and how often. 1-2 drinks in evening  Do you have history or are you using drugs? If yes, what do are you  using?  no  Have you ever had a stroke/heart attack?    Have you ever had a heart or other vascular stent placed,?  Do you take weight loss medication? no  Do you take any blood-thinning medications such as: (Plavix, aspirin, Coumadin, Aggrenox, Brilinta, Xarelto, Eliquis, Pradaxa, Savaysa or Effient)? mp  If yes we need the name, milligram, dosage and who is prescribing doctor:               Current Outpatient Medications  Medication Sig Dispense Refill   fluticasone-salmeterol (ADVAIR HFA) 115-21 MCG/ACT inhaler Inhale 2 puffs into the lungs 2 (two) times daily.      lansoprazole (PREVACID) 15 MG capsule Take 15 mg by mouth every other day.     levalbuterol (XOPENEX HFA) 45 MCG/ACT inhaler Inhale into the lungs every 4 (four) hours as needed for wheezing.     mirtazapine (REMERON) 15 MG tablet Take 15 mg by mouth at bedtime.     Misc Natural Products (GLUCOSAMINE CHOND COMPLEX/MSM PO) Take by mouth. Once daily     Multiple Vitamin (MULTIVITAMIN) tablet Take 1 tablet by mouth daily.     NON FORMULARY CPAP AT NIGHT     rosuvastatin  (CRESTOR) 20 MG tablet Take 20 mg by mouth daily.     valsartan (DIOVAN) 160 MG tablet Take 160 mg by mouth daily.     zolpidem (AMBIEN) 10 MG tablet Take 10 mg by mouth at bedtime as needed for sleep.      No current facility-administered medications for this visit.    Allergies  Allergen Reactions   Latex Other (See Comments)    Skin redness   Tiotropium Bromide Monohydrate Other (See Comments)    Patient reports blurred vision after taking Spiriva

## 2022-12-19 NOTE — Telephone Encounter (Signed)
2019 colonoscopy: three 4-7 mm polyps in sigmoid and ascending colon. Tubular adenoma and sessile serrated polyp.   Appropriate. ASA 3.

## 2022-12-20 NOTE — Telephone Encounter (Signed)
Will call pt once we receive Dr. Luvenia Starch future schedule.

## 2022-12-22 ENCOUNTER — Other Ambulatory Visit: Payer: Self-pay | Admitting: Orthopedic Surgery

## 2022-12-22 DIAGNOSIS — M19011 Primary osteoarthritis, right shoulder: Secondary | ICD-10-CM

## 2022-12-26 ENCOUNTER — Encounter: Payer: Self-pay | Admitting: Orthopedic Surgery

## 2022-12-27 DIAGNOSIS — E785 Hyperlipidemia, unspecified: Secondary | ICD-10-CM | POA: Diagnosis not present

## 2022-12-27 DIAGNOSIS — I700 Atherosclerosis of aorta: Secondary | ICD-10-CM | POA: Diagnosis not present

## 2022-12-27 DIAGNOSIS — Z125 Encounter for screening for malignant neoplasm of prostate: Secondary | ICD-10-CM | POA: Diagnosis not present

## 2022-12-27 DIAGNOSIS — Z79899 Other long term (current) drug therapy: Secondary | ICD-10-CM | POA: Diagnosis not present

## 2022-12-29 ENCOUNTER — Ambulatory Visit
Admission: RE | Admit: 2022-12-29 | Discharge: 2022-12-29 | Disposition: A | Discharge status: Home / Self Care | Payer: Medicare HMO | Source: Ambulatory Visit | Attending: Orthopedic Surgery | Admitting: Orthopedic Surgery

## 2022-12-29 DIAGNOSIS — M19011 Primary osteoarthritis, right shoulder: Secondary | ICD-10-CM

## 2022-12-29 DIAGNOSIS — M25511 Pain in right shoulder: Secondary | ICD-10-CM | POA: Diagnosis not present

## 2022-12-29 DIAGNOSIS — J439 Emphysema, unspecified: Secondary | ICD-10-CM | POA: Diagnosis not present

## 2023-01-02 DIAGNOSIS — E785 Hyperlipidemia, unspecified: Secondary | ICD-10-CM | POA: Diagnosis not present

## 2023-01-02 DIAGNOSIS — I10 Essential (primary) hypertension: Secondary | ICD-10-CM | POA: Diagnosis not present

## 2023-01-05 DIAGNOSIS — M19011 Primary osteoarthritis, right shoulder: Secondary | ICD-10-CM | POA: Diagnosis not present

## 2023-01-24 DIAGNOSIS — Z961 Presence of intraocular lens: Secondary | ICD-10-CM | POA: Diagnosis not present

## 2023-01-24 DIAGNOSIS — H538 Other visual disturbances: Secondary | ICD-10-CM | POA: Diagnosis not present

## 2023-01-25 NOTE — Telephone Encounter (Signed)
Pt is wanting to wait until after the first of the year to schedule his colonoscopy. He says that he will be having shoulder replacement surgery on 02/28/23 and wants to wait.

## 2023-01-25 NOTE — Telephone Encounter (Signed)
Spoke with pt to schedule procedure. He is going to check with spouse to see if he can do ? 8/15. He is going to call back and let us know

## 2023-02-28 DIAGNOSIS — M75101 Unspecified rotator cuff tear or rupture of right shoulder, not specified as traumatic: Secondary | ICD-10-CM | POA: Diagnosis not present

## 2023-02-28 DIAGNOSIS — Z471 Aftercare following joint replacement surgery: Secondary | ICD-10-CM | POA: Diagnosis not present

## 2023-02-28 DIAGNOSIS — Z9889 Other specified postprocedural states: Secondary | ICD-10-CM | POA: Diagnosis not present

## 2023-02-28 DIAGNOSIS — Z96611 Presence of right artificial shoulder joint: Secondary | ICD-10-CM | POA: Diagnosis not present

## 2023-02-28 DIAGNOSIS — G8918 Other acute postprocedural pain: Secondary | ICD-10-CM | POA: Diagnosis not present

## 2023-02-28 DIAGNOSIS — M25511 Pain in right shoulder: Secondary | ICD-10-CM | POA: Diagnosis not present

## 2023-02-28 DIAGNOSIS — M19011 Primary osteoarthritis, right shoulder: Secondary | ICD-10-CM | POA: Diagnosis not present

## 2023-02-28 HISTORY — PX: REVERSE SHOULDER ARTHROPLASTY: SHX5054

## 2023-03-15 DIAGNOSIS — Z471 Aftercare following joint replacement surgery: Secondary | ICD-10-CM | POA: Diagnosis not present

## 2023-03-15 DIAGNOSIS — Z96611 Presence of right artificial shoulder joint: Secondary | ICD-10-CM | POA: Diagnosis not present

## 2023-03-31 DIAGNOSIS — M25511 Pain in right shoulder: Secondary | ICD-10-CM | POA: Diagnosis not present

## 2023-04-04 DIAGNOSIS — M25511 Pain in right shoulder: Secondary | ICD-10-CM | POA: Diagnosis not present

## 2023-04-06 DIAGNOSIS — M25511 Pain in right shoulder: Secondary | ICD-10-CM | POA: Diagnosis not present

## 2023-04-11 DIAGNOSIS — M25511 Pain in right shoulder: Secondary | ICD-10-CM | POA: Diagnosis not present

## 2023-04-13 DIAGNOSIS — M25511 Pain in right shoulder: Secondary | ICD-10-CM | POA: Diagnosis not present

## 2023-04-14 ENCOUNTER — Encounter: Payer: Self-pay | Admitting: Primary Care

## 2023-04-14 ENCOUNTER — Ambulatory Visit: Payer: Medicare HMO | Admitting: Primary Care

## 2023-04-14 VITALS — BP 128/73 | HR 74 | Ht 70.0 in | Wt 247.0 lb

## 2023-04-14 DIAGNOSIS — G2581 Restless legs syndrome: Secondary | ICD-10-CM | POA: Diagnosis not present

## 2023-04-14 DIAGNOSIS — G4733 Obstructive sleep apnea (adult) (pediatric): Secondary | ICD-10-CM

## 2023-04-14 NOTE — Progress Notes (Signed)
 @Patient  ID: Ryan [name], male    DOB: 10-Aug-1955, 67 y.o.   MRN: [id number]  No chief complaint on file.   Referring provider: Carylon Perches, MD  HPI: 67 year old male, former smoker quit in 2010.  Past medical history significant for hypertension, AVM, OSA, allergic rhinitis, iron deficiency anemia.    04/14/2023 Patient send today for sleep consult. Transferring care from [facility] in [place]. He lives in [place]. Works in [employer].  Hx sleep apnea. He has been on CPAP for 10-12year. He receive replacement CPAP machine 2 years ago, he has a resmed airsense 11. Uses nasal mask. Typical bedtime is around 11pm. He has difficulty falling asleep, taking ambien 10mg  at bedtime as needed for insomnia most nights. Pain will cause him to wake up.   DME is [facility], he was getting emails annually when due for CPAP supplies. Needs renew prescription for CPAP.   Airview download 03/15/2019 - 04/13/2023 Days use greater than 4 hours 25/30 days (83%) average usage 6 hours 50 minutes Air leaks 25.29 L/min (95%) average AHI 0.7 events per hour  Allergies  Allergen Reactions   Latex Other (See Comments)    Skin redness   Tiotropium Bromide Monohydrate Other (See Comments)    Patient reports blurred vision after taking Spiriva    Immunization History  Administered Date(s) Administered   Influenza, Seasonal, Injecte, Preservative Fre 05/01/2013   Influenza,inj,quad, With Preservative 03/29/2018, 04/30/2019   Influenza-Unspecified 04/26/2011, 05/01/2012, 04/10/2014, 05/17/2016, 03/29/2018, 05/07/2018, 04/30/2019   Moderna Sars-Covid-2 Vaccination 09/11/2019, 10/02/2019    Past Medical History:  Diagnosis Date   Anemia    Arthritis    Asthma    COPD (chronic obstructive pulmonary disease) (HCC)    Depression    GERD (gastroesophageal reflux disease)    High cholesterol    diet controlled   Hypertension    diet controlled   [place] spotted fever 2019   Sleep apnea     CPAP    Tobacco History: Social History   Tobacco Use  Smoking Status Former   Current packs/day: 0.00   Average packs/day: 2.0 packs/day for 35.0 years (70.0 ttl pk-yrs)   Types: Cigarettes   Start date: 10/15/1973   Quit date: 10/15/2008   Years since quitting: 14.5  Smokeless Tobacco Never   Counseling given: Not Answered   Outpatient Medications Prior to Visit  Medication Sig Dispense Refill   fluticasone-salmeterol (ADVAIR HFA) 115-21 MCG/ACT inhaler Inhale 2 puffs into the lungs 2 (two) times daily.      lansoprazole (PREVACID) 15 MG capsule Take 15 mg by mouth every other day.     levalbuterol (XOPENEX HFA) 45 MCG/ACT inhaler Inhale into the lungs every 4 (four) hours as needed for wheezing.     mirtazapine (REMERON) 15 MG tablet Take 15 mg by mouth at bedtime.     Misc Natural Products (GLUCOSAMINE CHOND COMPLEX/MSM PO) Take by mouth. Once daily     Multiple Vitamin (MULTIVITAMIN) tablet Take 1 tablet by mouth daily.     NON FORMULARY CPAP AT NIGHT     rosuvastatin (CRESTOR) 20 MG tablet Take 20 mg by mouth daily.     valsartan (DIOVAN) 160 MG tablet Take 160 mg by mouth daily.     zolpidem (AMBIEN) 10 MG tablet Take 10 mg by mouth at bedtime as needed for sleep.      No facility-administered medications prior to visit.      Review of Systems  Review of Systems  Constitutional: Negative.  Respiratory: Negative.       Physical Exam  There were no vitals taken for this visit. Physical Exam Constitutional:      Appearance: Normal appearance.  Cardiovascular:     Rate and Rhythm: Regular rhythm.  Pulmonary:     Effort: Pulmonary effort is normal.     Breath sounds: Normal breath sounds.  Skin:    General: Skin is warm and dry.  Neurological:     General: No focal deficit present.     Mental Status: He is alert and oriented to person, place, and time. Mental status is at baseline.  Psychiatric:        Mood and Affect: Mood normal.        Behavior:  Behavior normal.        Thought Content: Thought content normal.        Judgment: Judgment normal.      Lab Results:  CBC    Component Value Date/Time   WBC 7.7 02/25/2019 1445   RBC 4.68 02/25/2019 1445   HGB 14.6 02/25/2019 1445   HGB 14.7 07/17/2015 1001   HCT 45.8 02/25/2019 1445   HCT 43.4 07/17/2015 1001   HCT 38 06/27/2014 0000   PLT 235 02/25/2019 1445   PLT 235 07/17/2015 1001   MCV 97.9 02/25/2019 1445   MCV 94 07/17/2015 1001   MCH 31.2 02/25/2019 1445   MCHC 31.9 02/25/2019 1445   RDW 12.4 02/25/2019 1445   RDW 12.0 (L) 07/17/2015 1001   LYMPHSABS 1.9 12/18/2017 1255   LYMPHSABS 1.8 07/17/2015 1001   MONOABS 0.4 12/18/2017 1255   EOSABS 0.1 12/18/2017 1255   EOSABS 0.2 07/17/2015 1001   BASOSABS 0.0 12/18/2017 1255   BASOSABS 0.0 07/17/2015 1001    BMET    Component Value Date/Time   NA 141 02/25/2019 1445   K 5.5 (H) 02/25/2019 1445   CL 105 02/25/2019 1445   CO2 26 02/25/2019 1445   GLUCOSE 93 02/25/2019 1445   BUN 16 02/25/2019 1445   CREATININE 1.12 02/25/2019 1445   CALCIUM 9.6 02/25/2019 1445   GFRNONAA >60 02/25/2019 1445   GFRAA >60 02/25/2019 1445    BNP No results found for: "BNP"  ProBNP No results found for: "PROBNP"  Imaging: No results found.   Assessment & Plan:   1. OBSTRUCTIVE SLEEP APNEA (Primary) - Ambulatory Referral for DME - Iron, TIBC and Ferritin Panel  2. RLS (restless legs syndrome) - Iron, TIBC and Ferritin Panel   OSA Hx sleep apnea. Recently relocated and needs prescription for CPAP supplies sent to local DME. He has been on CPAP for 10-12 year. Reports benefit from use. He receive replacement CPAP machine 2 years ago, he has a resmed airsense 11. Uses nasal mask. No changes recommended. -Renew CPAP supplies with Humberto Leep, NP 04/14/2023

## 2023-04-14 NOTE — Patient Instructions (Addendum)
Recommendations Continue to wear CPAP nightly Medications for RLS are gabapentin or Requip - let me know if you want to re-try  Continue Ambien for insomnia  Look into medcline pillow for side sleepers   Orders: Renew CPAP supplies Iron panel   Follow-up 1 year with Beth NP or sooner if needed    Ropinirole Tablets What is this medication? ROPINIROLE (roe PIN i role) treats the symptoms of Parkinson disease. It works by acting like dopamine, a substance in your body that helps manage movements and coordination. This reduces the symptoms of Parkinson, such as body stiffness and tremors. It may also be used to treat restless legs syndrome (RLS). This medicine may be used for other purposes; ask your health care provider or pharmacist if you have questions. COMMON BRAND NAME(S): Requip What should I tell my care team before I take this medication? They need to know if you have any of these conditions: Heart disease High blood pressure Kidney disease Liver disease Low blood pressure Narcolepsy Sleep apnea Tobacco use An unusual or allergic reaction to ropinirole, other medications, foods, dyes, or preservatives Pregnant or trying to get pregnant Breast-feeding How should I use this medication? Take this medication by mouth with water. Take it as directed on the prescription label. You can take it with or without food. If it upsets your stomach, take it with food. If it upsets your stomach, take it with food. Keep taking this medication unless your care team tells you to stop. Stopping it too quickly can cause serious side effects. It can also make your condition worse. Talk to your care team about the use of this medication in children. Special care may be needed. Overdosage: If you think you have taken too much of this medicine contact a poison control center or emergency room at once. NOTE: This medicine is only for you. Do not share this medicine with others. What if I miss a  dose? If you miss a dose, take it as soon as you can. If it is almost time for your next dose, take only that dose. Do not take double or extra doses. What may interact with this medication? Alcohol Antihistamines for allergy, cough and cold Certain medications for depression, anxiety, or mental health conditions Certain medications for seizures, such as phenobarbital, primidone Certain medications for sleep Ciprofloxacin Estrogen or progestin hormones Fluvoxamine General anesthetics, such as halothane, isoflurane, methoxyflurane, propofol Medications for blood pressure Medications that relax muscles for surgery Metoclopramide Opioid medications for pain Rifampin Tobacco This list may not describe all possible interactions. Give your health care provider a list of all the medicines, herbs, non-prescription drugs, or dietary supplements you use. Also tell them if you smoke, drink alcohol, or use illegal drugs. Some items may interact with your medicine. What should I watch for while using this medication? Visit your care team for regular checks on your progress. Tell your care team if your symptoms do not start to get better or if they get worse. Do not suddenly stop taking this medication. You may develop a severe reaction. Your care team will tell you how much medication to take. If your care team wants you to stop the medication, the dose may be slowly lowered over time to avoid any side effects. This medication may affect your coordination, reaction time, or judgement. Do not drive or operate machinery until you know how this medication affects you. Sit up or stand slowly to reduce the risk of dizzy or fainting spells. Drinking  alcohol with this medication can increase the risk of these side effects. When taking this medication, you may fall asleep without notice. You may be doing activities, such as driving a car, talking, or eating. You may not feel drowsy before it happens. Contact your  care team right away if this happens to you. There have been reports of increased sexual urges or other strong urges, such as gambling while taking this medication. If you experience any of these while taking this medication, you should report this to your care team as soon as possible. Your mouth may get dry. Chewing sugarless gum or sucking hard candy and drinking plenty of water may help. Contact your care team if the problem does not go away or is severe. What side effects may I notice from receiving this medication? Side effects that you should report to your care team as soon as possible: Allergic reactions--skin rash, itching, hives, swelling of the face, lips, tongue, or throat Falling asleep during daily activities Low blood pressure--dizziness, feeling faint or lightheaded, blurry vision Mood and behavior changes--anxiety, nervousness, irritability and restlessness, confusion, hallucinations, feeling distrust or suspicion of others New or worsening uncontrolled and repetitive movements of the face, mouth, or upper body Slow heartbeat--dizziness, feeling faint or lightheaded, confusion, trouble breathing, unusual weakness or fatigue Urges to engage in impulsive behaviors such as gambling, binge eating, sexual activity, or shopping in ways that are unusual for you Side effects that usually do not require medical attention (report to your care team if they continue or are bothersome): Dizziness Drowsiness Nausea Swelling of the ankles, hands, or feet Unusual weakness or fatigue Upset stomach Vomiting This list may not describe all possible side effects. Call your doctor for medical advice about side effects. You may report side effects to FDA at 1-800-FDA-1088. Where should I keep my medication? Keep out of the reach of children and pets. Store at room temperature between 20 and 25 degrees C (68 and 77 degrees F). Protect from light and moisture. Keep the container tightly closed. Get  rid of any unused medication after the expiration date. To get rid of medications that are no longer needed or have expired: Take the medication to a take-back program. Check with your pharmacy or law enforcement to find a location. If you cannot return the medication, check the label or package insert to see if the medication should be thrown out in the garbage or flushed down the toilet. If you are not sure, ask your care team. If it is safe to put it in the trash, empty the medication out of the container. Mix the medication with cat litter, dirt, coffee grounds, or other unwanted substance. Seal the mixture in a bag or container. Put it in the trash. NOTE: This sheet is a summary. It may not cover all possible information. If you have questions about this medicine, talk to your doctor, pharmacist, or health care provider.  2024 Elsevier/Gold Standard (2021-10-06 00:00:00)

## 2023-04-15 LAB — IRON,TIBC AND FERRITIN PANEL
Ferritin: 96 ng/mL (ref 30–400)
Iron Saturation: 32 % (ref 15–55)
Iron: 104 ug/dL (ref 38–169)
Total Iron Binding Capacity: 322 ug/dL (ref 250–450)
UIBC: 218 ug/dL (ref 111–343)

## 2023-04-18 DIAGNOSIS — M25511 Pain in right shoulder: Secondary | ICD-10-CM | POA: Diagnosis not present

## 2023-04-20 DIAGNOSIS — M25511 Pain in right shoulder: Secondary | ICD-10-CM | POA: Diagnosis not present

## 2023-04-25 DIAGNOSIS — M25511 Pain in right shoulder: Secondary | ICD-10-CM | POA: Diagnosis not present

## 2023-04-27 DIAGNOSIS — M25511 Pain in right shoulder: Secondary | ICD-10-CM | POA: Diagnosis not present

## 2023-05-01 DIAGNOSIS — M25511 Pain in right shoulder: Secondary | ICD-10-CM | POA: Diagnosis not present

## 2023-05-03 DIAGNOSIS — M25511 Pain in right shoulder: Secondary | ICD-10-CM | POA: Diagnosis not present

## 2023-05-04 DIAGNOSIS — M7662 Achilles tendinitis, left leg: Secondary | ICD-10-CM | POA: Diagnosis not present

## 2023-05-04 DIAGNOSIS — Z23 Encounter for immunization: Secondary | ICD-10-CM | POA: Diagnosis not present

## 2023-05-04 DIAGNOSIS — I10 Essential (primary) hypertension: Secondary | ICD-10-CM | POA: Diagnosis not present

## 2023-05-08 ENCOUNTER — Ambulatory Visit
Admission: RE | Admit: 2023-05-08 | Discharge: 2023-05-08 | Disposition: A | Discharge status: Home / Self Care | Payer: Medicare HMO | Source: Ambulatory Visit | Attending: Nurse Practitioner | Admitting: Nurse Practitioner

## 2023-05-08 VITALS — BP 141/83 | HR 90 | Temp 98.0°F | Resp 19

## 2023-05-08 DIAGNOSIS — R21 Rash and other nonspecific skin eruption: Secondary | ICD-10-CM

## 2023-05-08 DIAGNOSIS — M25561 Pain in right knee: Secondary | ICD-10-CM | POA: Diagnosis not present

## 2023-05-08 MED ORDER — DEXAMETHASONE SODIUM PHOSPHATE 10 MG/ML IJ SOLN
10.0000 mg | Freq: Once | INTRAMUSCULAR | Status: AC
  Administered 2023-05-08: 10 mg via INTRAMUSCULAR

## 2023-05-08 NOTE — Discharge Instructions (Signed)
Continue the oral antihistamine at nighttime to help with the itchy rash.  We gave you a steroid shot today to help with inflammation.  See care if you develop throat or tongue swelling or shortness of breath associated with the rash.

## 2023-05-08 NOTE — ED Triage Notes (Signed)
Pt c/o rash on face shoulder and arms and fever, pt states last night he had redness and itching all over and when he scratched it would burn.

## 2023-05-08 NOTE — ED Provider Notes (Signed)
[facility]    CSN: [id number] Arrival date & time: 05/08/23  1301      History   Chief Complaint Chief Complaint  Patient presents with   Rash    I went to bed last night and started itching all over but mainly head and face so got very little sleep but today face is red and splotch rash across chest shoulder as well as face and neck - Entered by patient    HPI Ryan [name] is a 67 y.o. male.   Patient presents today for 1 day history of itchy, raised, red rash to face and upper neck that began last night when he laid down for bed.  He denies any recent change in detergents, soaps, personal care products.  No recent changes in any medications.  No ingestion of allergens that he is aware of.  He took Xyzal last night and reports the itching is better today.  He also took ibuprofen this morning for a low-grade fever.  Reports history of recommend spotted fever, had a similar type of rash except it was all over when he was diagnosed with that.  He denies new headache, new muscle pain or joint aches.  No shortness of breath or tongue/throat swelling or itching.    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   Mucosal abnormality of stomach    AVM (arteriovenous malformation)    History of colonic polyps    Diverticulosis of colon without hemorrhage    IDA (iron deficiency anemia) 08/08/2014   Essential hypertension 01/04/2009   Allergic rhinitis 01/04/2009   OBSTRUCTIVE SLEEP APNEA 01/01/2009    Past Surgical History:  Procedure Laterality Date   CATARACT EXTRACTION  2018    CATARACT EXTRACTION W/PHACO Right 02/23/2015   Procedure: CATARACT EXTRACTION PHACO AND INTRAOCULAR LENS PLACEMENT (IOC);  Surgeon: Susa Simmonds, MD;  Location: [facility];  Service: Ophthalmology;  Laterality: Right;  CDE:5.02   CHOLECYSTECTOMY     COLONOSCOPY  03/15/2005   Dr. Rourk:Rectal polyps (diminutive), status post cold biopsy/removal/normal colon. hyperplastic   COLONOSCOPY N/A 08/27/2014   Dr. Jena Gauss: Multiple rectal and colonic polyps removed, one which was a 1 cm sessile polyp. Cecal AVMS ablated as described above. colonic Diverticulosis. Path with rectal hyperplastic polyp, ascending colon polyps with benign colonic mucosa, sessile serrated polyp with associated lipoma. Recommendation for surveillance colonoscopy in 3 years.    COLONOSCOPY WITH PROPOFOL N/A 12/25/2017   Procedure: COLONOSCOPY WITH PROPOFOL;  Surgeon: Corbin Ade, MD;  Location: [facility];  Service: Endoscopy;  Laterality: N/A;  1:45pm   ESOPHAGOGASTRODUODENOSCOPY N/A 08/27/2014   Dr. Rourk:Tiny distal esophageal erosions consistent with mild erosive reflux esophaigitis. Hiatal hernia. Antral erosions status post gastric biopsy. mild chronic gastritis, negative H.pylori   GIVENS CAPSULE STUDY N/A 11/05/2014   Procedure: GIVENS CAPSULE STUDY;  Surgeon: Corbin Ade, MD;  Location: [facility];  Service: Endoscopy;  Laterality: N/A;  0800   KNEE ARTHROSCOPY Right    POLYPECTOMY  12/25/2017   Procedure: POLYPECTOMY;  Surgeon: Corbin Ade, MD;  Location: [facility];  Service: Endoscopy;;   TOTAL SHOULDER ARTHROPLASTY Left 02/28/2019   Procedure: TOTAL SHOULDER ARTHROPLASTY;  Surgeon: Francena Hanly, MD;  Location: [facility];  Service: Orthopedics;  Laterality: Left;        Home Medications    Prior to Admission medications   Medication Sig Start Date End Date Taking? Authorizing Provider  fluticasone-salmeterol (ADVAIR HFA) 115-21 MCG/ACT inhaler Inhale 2 puffs into the lungs 2 (two) times daily.   09/14/10   [provider]  lansoprazole (PREVACID) 15 MG capsule Take 15 mg by mouth every other day.    [provider]  levalbuterol Pauline Aus HFA) 45 MCG/ACT inhaler Inhale into the lungs every 4 (four) hours as needed for wheezing.    [provider]  mirtazapine (REMERON) 15 MG tablet Take 15 mg by mouth at bedtime.    [provider]  Misc Natural Products (GLUCOSAMINE CHOND COMPLEX/MSM PO) Take by mouth. Once daily    [provider]  Multiple Vitamin (MULTIVITAMIN) tablet Take 1 tablet by mouth daily.    [provider]  NON FORMULARY CPAP AT NIGHT    [provider]  rosuvastatin (CRESTOR) 20 MG tablet Take 20 mg by mouth daily. 04/27/21   [provider]  valsartan (DIOVAN) 160 MG tablet Take 160 mg by mouth daily.    [provider]  zolpidem (AMBIEN) 10 MG tablet Take 10 mg by mouth at bedtime as needed for sleep.  03/09/10   [provider]    Family History Family History  Problem Relation Age of Onset   Cancer Mother        unsure primary   Cancer Father        prostate   Colon cancer Neg Hx     Social History Social History   Tobacco Use   Smoking status: Former    Current packs/day: 0.00    Average packs/day: 2.0 packs/day for 35.0 years (70.0 ttl pk-yrs)    Types: Cigarettes    Start date: 10/15/1973    Quit date: 10/15/2008    Years since quitting: 14.5   Smokeless tobacco: Never  Vaping Use   Vaping status: Never Used  Substance Use Topics   Alcohol use: Yes    Alcohol/week: 2.0 standard drinks of alcohol    Types: 2 Shots of liquor per week    Comment: couple of drinks of borboun every night   Drug use: No     Allergies   Latex and Tiotropium bromide monohydrate   Review of Systems Review of Systems Per HPI  Physical Exam Triage Vital Signs ED Triage Vitals  Encounter Vitals Group     BP 05/08/23 1311 (!) 141/83     Systolic BP Percentile --      Diastolic  BP Percentile --      Pulse Rate 05/08/23 1311 90     Resp 05/08/23 1311 19     Temp 05/08/23 1311 98 F (36.7 C)     Temp src --      SpO2 05/08/23 1311 91 %     Weight --      Height --      Head Circumference --      Peak Flow --      Pain Score 05/08/23 1313 0     Pain Loc --      Pain Education --  Exclude from Growth Chart --    No data found.  Updated Vital Signs BP (!) 141/83 (BP Location: Right Arm)   Pulse 90   Temp 98 F (36.7 C)   Resp 19   SpO2 91%   Visual Acuity Right Eye Distance:   Left Eye Distance:   Bilateral Distance:    Right Eye Near:   Left Eye Near:    Bilateral Near:     Physical Exam Vitals and nursing note reviewed.  Constitutional:      General: He is not in acute distress.    Appearance: Normal appearance. He is not toxic-appearing.  HENT:     Head: Normocephalic and atraumatic.     Mouth/Throat:     Mouth: Mucous membranes are moist.     Pharynx: Oropharynx is clear.  Pulmonary:     Effort: Pulmonary effort is normal. No respiratory distress.  Skin:    General: Skin is warm and dry.     Capillary Refill: Capillary refill takes less than 2 seconds.     Coloration: Skin is not jaundiced or pale.     Findings: Erythema and rash present. Rash is papular.     Comments: Slight erythema noted to upper chest with discrete, random erythematous papules.  No warmth, drainage, active bruising, hives.  Neurological:     Mental Status: He is alert and oriented to person, place, and time.  Psychiatric:        Behavior: Behavior is cooperative.      UC Treatments / Results  Labs (all labs ordered are listed, but only abnormal results are displayed) Labs Reviewed - No data to display  EKG   Radiology No results found.  Procedures Procedures (including critical care time)  Medications Ordered in UC Medications  dexamethasone (DECADRON) injection 10 mg (10 mg Intramuscular Given 05/08/23 1350)    Initial Impression /  Assessment and Plan / UC Course  I have reviewed the triage vital signs and the nursing notes.  Pertinent labs & imaging results that were available during my care of the patient were reviewed by me and considered in my medical decision making (see chart for details).   Patient is well-appearing, normotensive, afebrile, not tachycardic, not tachypneic, oxygenating well on room air.    1. Rash and nonspecific skin eruption Overall, vitals and exam are reassuring Oropharynx is clear, lungs are clear to auscultation patient is not in any acute respiratory distress Suspect possible allergic reaction as cause of rash Treat with ongoing oral antihistamine regimen, IM Decadron 10 mg given in urgent care today for itching Strict ER precautions discussed with patient  The patient was given the opportunity to ask questions.  All questions answered to their satisfaction.  The patient is in agreement to this plan.    Final Clinical Impressions(s) / UC Diagnoses   Final diagnoses:  Rash and nonspecific skin eruption     Discharge Instructions      Continue the oral antihistamine at nighttime to help with the itchy rash.  We gave you a steroid shot today to help with inflammation.  See care if you develop throat or tongue swelling or shortness of breath associated with the rash.    ED Prescriptions   None    PDMP not reviewed this encounter.   Valentino Nose, NP 05/08/23 1422

## 2023-05-10 DIAGNOSIS — M25511 Pain in right shoulder: Secondary | ICD-10-CM | POA: Diagnosis not present

## 2023-05-16 DIAGNOSIS — M25511 Pain in right shoulder: Secondary | ICD-10-CM | POA: Diagnosis not present

## 2023-05-17 DIAGNOSIS — M67962 Unspecified disorder of synovium and tendon, left lower leg: Secondary | ICD-10-CM | POA: Diagnosis not present

## 2023-05-18 DIAGNOSIS — M25511 Pain in right shoulder: Secondary | ICD-10-CM | POA: Diagnosis not present

## 2023-05-23 DIAGNOSIS — M25511 Pain in right shoulder: Secondary | ICD-10-CM | POA: Diagnosis not present

## 2023-05-24 DIAGNOSIS — Z96611 Presence of right artificial shoulder joint: Secondary | ICD-10-CM | POA: Diagnosis not present

## 2023-05-25 DIAGNOSIS — M25511 Pain in right shoulder: Secondary | ICD-10-CM | POA: Diagnosis not present

## 2023-06-01 DIAGNOSIS — M25511 Pain in right shoulder: Secondary | ICD-10-CM | POA: Diagnosis not present

## 2023-06-06 DIAGNOSIS — I517 Cardiomegaly: Secondary | ICD-10-CM | POA: Diagnosis not present

## 2023-06-06 DIAGNOSIS — J439 Emphysema, unspecified: Secondary | ICD-10-CM | POA: Diagnosis not present

## 2023-06-06 DIAGNOSIS — M25511 Pain in right shoulder: Secondary | ICD-10-CM | POA: Diagnosis not present

## 2023-06-06 DIAGNOSIS — I270 Primary pulmonary hypertension: Secondary | ICD-10-CM | POA: Diagnosis not present

## 2023-06-06 DIAGNOSIS — R0609 Other forms of dyspnea: Secondary | ICD-10-CM | POA: Diagnosis not present

## 2023-06-06 DIAGNOSIS — G4733 Obstructive sleep apnea (adult) (pediatric): Secondary | ICD-10-CM | POA: Diagnosis not present

## 2023-06-12 DIAGNOSIS — B078 Other viral warts: Secondary | ICD-10-CM | POA: Diagnosis not present

## 2023-06-13 DIAGNOSIS — M7662 Achilles tendinitis, left leg: Secondary | ICD-10-CM | POA: Diagnosis not present

## 2023-06-13 DIAGNOSIS — M25511 Pain in right shoulder: Secondary | ICD-10-CM | POA: Diagnosis not present

## 2023-06-15 DIAGNOSIS — M25511 Pain in right shoulder: Secondary | ICD-10-CM | POA: Diagnosis not present

## 2023-06-20 DIAGNOSIS — M25511 Pain in right shoulder: Secondary | ICD-10-CM | POA: Diagnosis not present

## 2023-06-22 DIAGNOSIS — M25511 Pain in right shoulder: Secondary | ICD-10-CM | POA: Diagnosis not present

## 2023-06-27 DIAGNOSIS — M25511 Pain in right shoulder: Secondary | ICD-10-CM | POA: Diagnosis not present

## 2023-06-28 DIAGNOSIS — M67962 Unspecified disorder of synovium and tendon, left lower leg: Secondary | ICD-10-CM | POA: Diagnosis not present

## 2023-06-29 DIAGNOSIS — M25511 Pain in right shoulder: Secondary | ICD-10-CM | POA: Diagnosis not present

## 2023-07-04 DIAGNOSIS — M25572 Pain in left ankle and joints of left foot: Secondary | ICD-10-CM | POA: Diagnosis not present

## 2023-07-10 DIAGNOSIS — M6702 Short Achilles tendon (acquired), left ankle: Secondary | ICD-10-CM | POA: Diagnosis not present

## 2023-07-10 DIAGNOSIS — M67962 Unspecified disorder of synovium and tendon, left lower leg: Secondary | ICD-10-CM | POA: Diagnosis not present

## 2023-07-26 DIAGNOSIS — M6702 Short Achilles tendon (acquired), left ankle: Secondary | ICD-10-CM | POA: Diagnosis not present

## 2023-07-28 DIAGNOSIS — J069 Acute upper respiratory infection, unspecified: Secondary | ICD-10-CM | POA: Diagnosis not present

## 2023-09-05 DIAGNOSIS — I10 Essential (primary) hypertension: Secondary | ICD-10-CM | POA: Diagnosis not present

## 2023-09-05 DIAGNOSIS — J440 Chronic obstructive pulmonary disease with acute lower respiratory infection: Secondary | ICD-10-CM | POA: Diagnosis not present

## 2023-09-21 DIAGNOSIS — L309 Dermatitis, unspecified: Secondary | ICD-10-CM | POA: Diagnosis not present

## 2023-10-09 DIAGNOSIS — N200 Calculus of kidney: Secondary | ICD-10-CM | POA: Diagnosis not present

## 2023-10-10 DIAGNOSIS — M62462 Contracture of muscle, left lower leg: Secondary | ICD-10-CM | POA: Diagnosis not present

## 2023-10-10 DIAGNOSIS — M6702 Short Achilles tendon (acquired), left ankle: Secondary | ICD-10-CM | POA: Diagnosis not present

## 2023-10-10 DIAGNOSIS — G8918 Other acute postprocedural pain: Secondary | ICD-10-CM | POA: Diagnosis not present

## 2023-10-10 DIAGNOSIS — M7662 Achilles tendinitis, left leg: Secondary | ICD-10-CM | POA: Diagnosis not present

## 2023-10-10 HISTORY — PX: ACHILLES TENDON SURGERY: SHX542

## 2023-10-31 ENCOUNTER — Ambulatory Visit: Admitting: General Surgery

## 2023-10-31 ENCOUNTER — Encounter: Payer: Self-pay | Admitting: General Surgery

## 2023-10-31 VITALS — BP 130/83 | HR 75 | Temp 98.2°F | Resp 14 | Ht 70.0 in | Wt 253.0 lb

## 2023-10-31 DIAGNOSIS — R1032 Left lower quadrant pain: Secondary | ICD-10-CM | POA: Diagnosis not present

## 2023-11-01 NOTE — Progress Notes (Signed)
 Ryan [name]; [id number]; September 25, 1955   HPI Patient is a 68 year old white male who returns to my care for evaluation treatment of left groin pain.  He states that he has been doing well since I last saw him until 1 month ago when he experienced some left groin pain.  He states he was straining and developed left groin pain.  It lasted a few weeks but recently has not been as significant.  He denies any bulging in the left groin region.  He denies any radiation of the pain to the left testicle. Past Medical History:  Diagnosis Date   Anemia    Arthritis    Asthma    COPD (chronic obstructive pulmonary disease) (HCC)    Depression    GERD (gastroesophageal reflux disease)    High cholesterol    diet controlled   Hypertension    diet controlled   [facility] spotted fever 2019   Sleep apnea    CPAP    Past Surgical History:  Procedure Laterality Date   CATARACT EXTRACTION  2018   CATARACT EXTRACTION W/PHACO Right 02/23/2015   Procedure: CATARACT EXTRACTION PHACO AND INTRAOCULAR LENS PLACEMENT (IOC);  Surgeon: Clay Cummins, MD;  Location: [facility];  Service: Ophthalmology;  Laterality: Right;  CDE:5.02   CHOLECYSTECTOMY     COLONOSCOPY  03/15/2005   Dr. Rourk:Rectal polyps (diminutive), status post cold biopsy/removal/normal colon. hyperplastic   COLONOSCOPY N/A 08/27/2014   Dr. Riley Cheadle: Multiple rectal and colonic polyps removed, one which was a 1 cm sessile polyp. Cecal AVMS ablated as described above. colonic Diverticulosis. Path with rectal hyperplastic polyp, ascending colon polyps with benign colonic mucosa, sessile serrated polyp with associated lipoma. Recommendation for surveillance colonoscopy in 3 years.    COLONOSCOPY WITH PROPOFOL  N/A 12/25/2017   Procedure: COLONOSCOPY WITH PROPOFOL ;  Surgeon: Suzette Espy, MD;  [facility];  Service: Endoscopy;  Laterality: N/A;  1:45pm   ESOPHAGOGASTRODUODENOSCOPY N/A 08/27/2014   Dr. Rourk:Tiny distal esophageal erosions  consistent with mild erosive reflux esophaigitis. Hiatal hernia. Antral erosions status post gastric biopsy. mild chronic gastritis, negative H.pylori   GIVENS CAPSULE STUDY N/A 11/05/2014   Procedure: GIVENS CAPSULE STUDY;  Surgeon: Suzette Espy, MD;  Location: [facility];  Service: Endoscopy;  Laterality: N/A;  0800   KNEE ARTHROSCOPY Right    POLYPECTOMY  12/25/2017   Procedure: POLYPECTOMY;  Surgeon: Suzette Espy, MD;  [facility]: [facility];  Service: Endoscopy;;   TOTAL SHOULDER ARTHROPLASTY Left 02/28/2019   Procedure: TOTAL SHOULDER ARTHROPLASTY;  Surgeon: Ellard Gunning, MD;  Location: [facility];  Service: Orthopedics;  Laterality: Left;     Family History  Problem Relation Age of Onset   Cancer Mother        unsure primary   Cancer Father        prostate   Colon cancer Neg Hx     Current Outpatient Medications on File Prior to Visit  Medication Sig Dispense Refill   fluticasone -salmeterol (ADVAIR HFA) 115-21 MCG/ACT inhaler Inhale 2 puffs into the lungs 2 (two) times daily.      lansoprazole (PREVACID) 15 MG capsule Take 15 mg by mouth every other day.     levalbuterol  (XOPENEX  HFA) 45 MCG/ACT inhaler Inhale into the lungs every 4 (four) hours as needed for wheezing.     mirtazapine  (REMERON ) 15 MG tablet Take 15 mg by mouth at bedtime.     Misc Natural Products (GLUCOSAMINE CHOND COMPLEX/MSM PO) Take by mouth.  Once daily     Multiple Vitamin (MULTIVITAMIN) tablet Take 1 tablet by mouth daily.     NON FORMULARY CPAP AT NIGHT     rosuvastatin (CRESTOR) 20 MG tablet Take 20 mg by mouth daily.     zolpidem  (AMBIEN ) 10 MG tablet Take 10 mg by mouth at bedtime as needed for sleep.      No current facility-administered medications on file prior to visit.    Allergies  Allergen Reactions   Latex Other (See Comments)    Skin redness   Tiotropium Bromide Monohydrate Other (See Comments)    Patient reports blurred vision after taking Spiriva    Social History    Substance and Sexual Activity  Alcohol Use Yes   Alcohol/week: 2.0 standard drinks of alcohol   Types: 2 Shots of liquor per week   Comment: couple of drinks of borboun every night    Social History   Tobacco Use  Smoking Status Former   Current packs/day: 0.00   Average packs/day: 2.0 packs/day for 35.0 years (70.0 ttl pk-yrs)   Types: Cigarettes   Start date: 10/15/1973   Quit date: 10/15/2008   Years since quitting: 15.0  Smokeless Tobacco Never    Review of Systems  Constitutional: Negative.   HENT: Negative.    Eyes: Negative.   Respiratory: Negative.    Cardiovascular: Negative.   Gastrointestinal: Negative.   Genitourinary: Negative.   Musculoskeletal:  Positive for back pain and joint pain.  Skin: Negative.   Neurological: Negative.   Endo/Heme/Allergies: Negative.   Psychiatric/Behavioral: Negative.      Objective   Vitals:   10/31/23 1526  BP: 130/83  Pulse: 75  Resp: 14  Temp: 98.2 F (36.8 C)  SpO2: 94%    Physical Exam Vitals reviewed.  Constitutional:      Appearance: Normal appearance. He is obese. He is not ill-appearing.  HENT:     Head: Normocephalic and atraumatic.  Cardiovascular:     Rate and Rhythm: Normal rate and regular rhythm.     Heart sounds: Normal heart sounds. No murmur heard.    No friction rub. No gallop.  Pulmonary:     Effort: Pulmonary effort is normal. No respiratory distress.     Breath sounds: Normal breath sounds. No stridor. No wheezing, rhonchi or rales.  Abdominal:     General: Bowel sounds are normal. There is no distension.     Palpations: Abdomen is soft. There is no mass.     Tenderness: There is no abdominal tenderness. There is no guarding or rebound.     Hernia: No hernia is present.     Comments: I could not feel a left inguinal hernia present.  No swelling was present.  Right inguinal region unremarkable.  Genitourinary:    Testes: Normal.  Skin:    General: Skin is warm and dry.  Neurological:      Mental Status: He is alert and oriented to person, place, and time.   Previous office note reviewed  Assessment  Left groin pain, musculoskeletal Plan  I reassured the patient that I do not appreciate a left inguinal hernia and surgery is not indicated.  He was fine with that.  I told him to return to my care should he have any questions or starts noticing a bulge.  Follow-up here as needed.

## 2023-11-30 ENCOUNTER — Encounter (HOSPITAL_BASED_OUTPATIENT_CLINIC_OR_DEPARTMENT_OTHER): Payer: Self-pay | Admitting: Pulmonary Disease

## 2023-11-30 ENCOUNTER — Ambulatory Visit (HOSPITAL_BASED_OUTPATIENT_CLINIC_OR_DEPARTMENT_OTHER): Admitting: Pulmonary Disease

## 2023-11-30 VITALS — BP 134/86 | HR 64 | Ht 70.0 in | Wt 242.7 lb

## 2023-11-30 DIAGNOSIS — G4733 Obstructive sleep apnea (adult) (pediatric): Secondary | ICD-10-CM | POA: Diagnosis not present

## 2023-11-30 DIAGNOSIS — Z87891 Personal history of nicotine dependence: Secondary | ICD-10-CM

## 2023-11-30 DIAGNOSIS — J449 Chronic obstructive pulmonary disease, unspecified: Secondary | ICD-10-CM | POA: Diagnosis not present

## 2023-11-30 DIAGNOSIS — J4489 Other specified chronic obstructive pulmonary disease: Secondary | ICD-10-CM

## 2023-11-30 MED ORDER — FLUTICASONE-SALMETEROL 250-50 MCG/ACT IN AEPB: 1.0000 | INHALATION_SPRAY | Freq: Two times a day (BID) | RESPIRATORY_TRACT | 3 refills | Status: AC

## 2023-11-30 NOTE — Patient Instructions (Addendum)
 X schedule PFTs  X Lung cancer screening   X Change to auto CPAP 12-14 cm  Refills on [facility]

## 2023-11-30 NOTE — Progress Notes (Signed)
 Subjective:    Patient ID: Ryan [name], male    DOB: 1956-04-15, 68 y.o.   MRN: [id number]  HPI 68 yo for FU of OSA & RLS -former smoker quit in 2010 , 68 Pyrs  Seen by BW 04/2023 for sleep consult Now presents to consult for COPD  He has used a CPAP machine for approximately 20 years, with the current machine set at 12 cm H2O. He uses it nightly except when traveling. He experiences issues with CPAP supply service notifications but currently has adequate supplies. Despite CPAP use, he still feels tired and experiences some snoring. He uses a nose mask and has tried a chin strap.  He experiences shortness of breath, especially on inclines or stairs, related to COPD. He quit smoking 11 to 12 years ago after a long history of smoking. He switched from Advair to [place] due to insurance changes and is uncertain of its full impact. He does not have frequent flare-ups but has a cough that sounds productive. He has not had recent pulmonary function tests.  He intentionally lost 65 pounds around the time he quit smoking but has gained 10 pounds in the last two years due to reduced physical activity following an Achilles tendon tear.  He has been referred to Dr. Roselind Congo for pulmonary hypertension evaluation at [facility], echo did not show any evidence of pulm hypertension spirometry showed FEV1 of 78%   CPAP download shows excellent control of events on 12 cm with minimal leak and good compliance with 8 hours per night.  CPAP certainly helped improve his daytime somnolence and fatigue  Significant tests/ events reviewed Echo 05/2023 RVSP 25 Spirometry 2011 ratio 73, FEV1 78%, FVC 84%   07/2010 CPAP titration >> 12cm, PLMs + 03/2005 NPSG RDI 35/h, low sat 85%   Past Medical History:  Diagnosis Date   Anemia    Arthritis    Asthma    COPD (chronic obstructive pulmonary disease) (HCC)    Depression    GERD (gastroesophageal reflux disease)    High cholesterol    diet controlled    Hypertension    diet controlled   [facility] spotted fever 2019   Sleep apnea    CPAP    Past Surgical History:  Procedure Laterality Date   ACHILLES TENDON SURGERY Left 10/10/2023   CATARACT EXTRACTION  2018   CATARACT EXTRACTION W/PHACO Right 02/23/2015   Procedure: CATARACT EXTRACTION PHACO AND INTRAOCULAR LENS PLACEMENT (IOC);  Surgeon: Clay Cummins, MD;  Location: [facility];  Service: Ophthalmology;  Laterality: Right;  CDE:5.02   CHOLECYSTECTOMY     COLONOSCOPY  03/15/2005   Dr. Rourk:Rectal polyps (diminutive), status post cold biopsy/removal/normal colon. hyperplastic   COLONOSCOPY N/A 08/27/2014   Dr. Riley Cheadle: Multiple rectal and colonic polyps removed, one which was a 1 cm sessile polyp. Cecal AVMS ablated as described above. colonic Diverticulosis. Path with rectal hyperplastic polyp, ascending colon polyps with benign colonic mucosa, sessile serrated polyp with associated lipoma. Recommendation for surveillance colonoscopy in 3 years.    COLONOSCOPY WITH PROPOFOL  N/A 12/25/2017   Procedure: COLONOSCOPY WITH PROPOFOL ;  Surgeon: Suzette Espy, MD;  Location: [facility];  Service: Endoscopy;  Laterality: N/A;  1:45pm   ESOPHAGOGASTRODUODENOSCOPY N/A 08/27/2014   Dr. Rourk:Tiny distal esophageal erosions consistent with mild erosive reflux esophaigitis. Hiatal hernia. Antral erosions status post gastric biopsy. mild chronic gastritis, negative H.pylori   GIVENS CAPSULE STUDY N/A 11/05/2014   Procedure: GIVENS CAPSULE STUDY;  Surgeon: Windsor Hatcher  Rourk, MD;  Location: [facility];  Service: Endoscopy;  Laterality: N/A;  0800   KNEE ARTHROSCOPY Right    POLYPECTOMY  12/25/2017   Procedure: POLYPECTOMY;  Surgeon: Suzette Espy, MD;  Location: [facility];  Service: Endoscopy;;   REVERSE SHOULDER ARTHROPLASTY  02/28/2023   TOTAL SHOULDER ARTHROPLASTY Left 02/28/2019   Procedure: TOTAL SHOULDER ARTHROPLASTY;  Surgeon: Ellard Gunning, MD;  Location: [facility];  Service:  Orthopedics;  Laterality: Left;     Allergies  Allergen Reactions   Latex Other (See Comments)    Skin redness   Tiotropium Bromide Monohydrate Other (See Comments)    Patient reports blurred vision after taking Spiriva   Social History   Socioeconomic History   Marital status: Married    Spouse name: Valinda Gault   Number of children: 0   Years of education: Not on file   Highest education level: Some college, no degree  Occupational History   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  Tobacco Use   Smoking status: Former    Current packs/day: 0.00    Average packs/day: 2.0 packs/day for 35.0 years (70.0 ttl pk-yrs)    Types: Cigarettes    Start date: 10/15/1973    Quit date: 10/15/2008    Years since quitting: 15.1   Smokeless tobacco: Never  Vaping Use   Vaping status: Never Used  Substance and Sexual Activity   Alcohol use: Yes    Alcohol/week: 2.0 standard drinks of alcohol    Types: 2 Shots of liquor per week    Comment: couple of drinks of borboun every night   Drug use: No   Sexual activity: Yes    Birth control/protection: None  Other Topics Concern   Not on file  Social History Narrative   Lives with wife   Caffeine-coffee 3-4 c daily   Social Drivers of Corporate investment banker Strain: Not on file  Food Insecurity: Not on file  Transportation Needs: Not on file  Physical Activity: Not on file  Stress: Not on file  Social Connections: Not on file  Intimate Partner Violence: Not on file    Family History  Problem Relation Age of Onset   Cancer Mother        unsure primary   Cancer Father        prostate   Colon cancer Neg Hx      Review of Systems Constitutional: negative for anorexia, fevers and sweats  Eyes: negative for irritation, redness and visual disturbance  Ears, nose, mouth, throat, and face: negative for earaches, epistaxis, nasal congestion and sore throat  Respiratory: negative for cough,  sputum and wheezing  Cardiovascular:  negative for chest pain, lower extremity edema, orthopnea, palpitations and syncope  Gastrointestinal: negative for abdominal pain, constipation, diarrhea, melena, nausea and vomiting  Genitourinary:negative for dysuria, frequency and hematuria  Hematologic/lymphatic: negative for bleeding, easy bruising and lymphadenopathy  Musculoskeletal:negative for arthralgias, muscle weakness and stiff joints  Neurological: negative for coordination problems, gait problems, headaches and weakness  Endocrine: negative for diabetic symptoms including polydipsia, polyuria and weight loss     Objective:   Physical Exam  Gen. Pleasant, obese, in no distress, normal affect ENT - no pallor,icterus, no post nasal drip, class 2-3 airway Neck: No JVD, no thyromegaly, no carotid bruits Lungs: no use of accessory muscles, no dullness to percussion, decreased without rales or rhonchi  Cardiovascular: Rhythm regular, heart sounds  normal, no murmurs or gallops, no peripheral edema Abdomen: soft and non-tender,  no hepatosplenomegaly, BS normal. Musculoskeletal: No deformities, no cyanosis or clubbing Neuro:  alert, non focal, no tremors       Assessment & Plan:    Obstructive Sleep Apnea Long-standing obstructive sleep apnea managed with CPAP for approximately 20 years. Current CPAP machine is 70-39 years old, set at 12 cm H2O. He uses CPAP nightly with good adherence, though experiences occasional snoring and air leakage through lips. Possible improvement in blood pressure and reduction in snoring noted. Issues with CPAP supply management through [facility], with difficulties in re-establishing email notifications for supply orders. - Send prescription to [facility] for CPAP supplies. - Adjust CPAP pressure range to 12-14 cm H2O to address snoring and potential obstructions. - Advise him to visit [facility] office to resolve supply notification issues. - Ensure he is on the list for direct access to CPAP settings  adjustments.  Chronic Obstructive Pulmonary Disease (COPD) Mild COPD with exertional dyspnea, particularly on inclines and stairs. Former smoker with significant smoking history, quit approximately 10-12 years ago. Currently on Wixela, previously on Advair, with recent switch due to formulary changes. Reports some improvement with Wixela but uncertain of full impact. No recent pulmonary function tests or imaging to assess current status. No frequent exacerbations or significant cough. Previous echocardiogram showed normal heart function with mild RV dysfunction. Discussed potential benefits of pulmonary rehabilitation if lung function tests are well-managed, focusing on improving conditioning rather than increasing medication. - Order pulmonary function tests to assess current lung function. - Refer to lung cancer screening program for CT scan due to smoking history. - If pulmonary function tests are well-managed, consider pulmonary rehabilitation to improve conditioning. - Provide refill for Wixela inhaler.

## 2023-12-14 ENCOUNTER — Telehealth: Payer: Self-pay

## 2023-12-14 DIAGNOSIS — J42 Unspecified chronic bronchitis: Secondary | ICD-10-CM

## 2023-12-14 DIAGNOSIS — Z122 Encounter for screening for malignant neoplasm of respiratory organs: Secondary | ICD-10-CM

## 2023-12-14 DIAGNOSIS — Z87891 Personal history of nicotine dependence: Secondary | ICD-10-CM

## 2023-12-14 NOTE — Telephone Encounter (Signed)
 Lung Cancer Screening Narrative/Criteria Questionnaire (Cigarette Smokers Only- No Cigars/Pipes/vapes)   Ryan [name]   SDMV:12/27/2023 at 10:00 am          [birth year]   LDCT: 01/04/2024 at 12:30 pm at [facility]    68 y.o.   Phone: [phone]  Lung Screening Narrative (confirm age 35-77 yrs Medicare / 50-80 yrs Private pay insurance)   Insurance information: Paramedic   Referring Provider: Hildy Lowers   This screening involves an initial phone call with a team member from our program. It is called a shared decision making visit. The initial meeting is required by  insurance and Medicare to make sure you understand the program. This appointment takes about 15-20 minutes to complete. You will complete the screening scan at your scheduled date/time.  This scan takes about 5-10 minutes to complete. You can eat and drink normally before and after the scan.  Criteria questions for Lung Cancer Screening:   Are you a current or former smoker? Former Age began smoking: 16   If you are a former smoker, what year did you quit smoking? Quit 2013 and one additional year( within 15 yrs)   To calculate your smoking history, I need an accurate estimate of how many packs of cigarettes you smoked per day and for how many years. (Not just the number of PPD you are now smoking)   Years smoking 39 x Packs per day 2 = Pack years 55   (at least 20 pack yrs)   (Make sure they understand that we need to know how much they have smoked in the past, not just the number of PPD they are smoking now)  Do you have a personal history of cancer?  No    Do you have a family history of cancer? Yes  (cancer type and and relative) Father prostate cancer and mother stomach cancer with mets  Are you coughing up blood?  No  Have you had unexplained weight loss of 15 lbs or more in the last 6 months? No  It looks like you meet all criteria.  When would be a good time for us  to schedule you for this screening?   Additional  information: N/A

## 2023-12-18 ENCOUNTER — Ambulatory Visit (HOSPITAL_BASED_OUTPATIENT_CLINIC_OR_DEPARTMENT_OTHER): Admitting: Pulmonary Disease

## 2023-12-18 ENCOUNTER — Ambulatory Visit: Payer: Self-pay | Admitting: Pulmonary Disease

## 2023-12-18 DIAGNOSIS — J439 Emphysema, unspecified: Secondary | ICD-10-CM

## 2023-12-18 DIAGNOSIS — J4489 Other specified chronic obstructive pulmonary disease: Secondary | ICD-10-CM | POA: Diagnosis not present

## 2023-12-18 LAB — PULMONARY FUNCTION TEST
DL/VA % pred: 83 %
DL/VA: 3.4 ml/min/mmHg/L
DLCO cor % pred: 74 %
DLCO cor: 19.54 ml/min/mmHg
DLCO unc % pred: 74 %
DLCO unc: 19.54 ml/min/mmHg
FEF 25-75 Post: 2.88 L/s
FEF 25-75 Pre: 2.23 L/s
FEF2575-%Change-Post: 29 %
FEF2575-%Pred-Post: 112 %
FEF2575-%Pred-Pre: 86 %
FEV1-%Change-Post: 4 %
FEV1-%Pred-Post: 95 %
FEV1-%Pred-Pre: 91 %
FEV1-Post: 3.19 L
FEV1-Pre: 3.06 L
FEV1FVC-%Change-Post: 2 %
FEV1FVC-%Pred-Pre: 101 %
FEV6-%Change-Post: 1 %
FEV6-%Pred-Post: 96 %
FEV6-%Pred-Pre: 95 %
FEV6-Post: 4.12 L
FEV6-Pre: 4.06 L
FEV6FVC-%Change-Post: 0 %
FEV6FVC-%Pred-Post: 104 %
FEV6FVC-%Pred-Pre: 105 %
FVC-%Change-Post: 2 %
FVC-%Pred-Post: 92 %
FVC-%Pred-Pre: 90 %
FVC-Post: 4.15 L
FVC-Pre: 4.06 L
Post FEV1/FVC ratio: 77 %
Post FEV6/FVC ratio: 99 %
Pre FEV1/FVC ratio: 75 %
Pre FEV6/FVC Ratio: 100 %
RV % pred: 76 %
RV: 1.83 L
TLC % pred: 88 %
TLC: 6.18 L

## 2023-12-18 NOTE — Progress Notes (Signed)
 Full PFT performed today.

## 2023-12-18 NOTE — Patient Instructions (Signed)
Full PFT performed today, 

## 2023-12-19 ENCOUNTER — Encounter (HOSPITAL_BASED_OUTPATIENT_CLINIC_OR_DEPARTMENT_OTHER): Payer: Self-pay | Admitting: Pulmonary Disease

## 2023-12-19 DIAGNOSIS — M67962 Unspecified disorder of synovium and tendon, left lower leg: Secondary | ICD-10-CM | POA: Diagnosis not present

## 2023-12-19 NOTE — Progress Notes (Signed)
 Pt.notified

## 2023-12-25 DIAGNOSIS — M67962 Unspecified disorder of synovium and tendon, left lower leg: Secondary | ICD-10-CM | POA: Diagnosis not present

## 2023-12-26 ENCOUNTER — Ambulatory Visit (HOSPITAL_BASED_OUTPATIENT_CLINIC_OR_DEPARTMENT_OTHER): Admitting: Pulmonary Disease

## 2023-12-27 ENCOUNTER — Ambulatory Visit: Admitting: Acute Care

## 2023-12-27 DIAGNOSIS — Z87891 Personal history of nicotine dependence: Secondary | ICD-10-CM | POA: Diagnosis not present

## 2023-12-27 NOTE — Patient Instructions (Signed)

## 2023-12-27 NOTE — Progress Notes (Signed)
 Virtual Visit via Telephone Note  I connected with Ryan [name] on 12/27/23 at 10:00 AM EDT by telephone and verified that I am speaking with the correct person using two identifiers.  Location: Patient: Ryan [name] Provider: Alyse Bach, RN   I discussed the limitations, risks, security and privacy concerns of performing an evaluation and management service by telephone and the availability of in person appointments. I also discussed with the patient that there may be a patient responsible charge related to this service. The patient expressed understanding and agreed to proceed.   Shared Decision Making Visit Lung Cancer Screening Program [phone])   Eligibility: Age 68 y.o. Pack Years Smoking History Calculation 57 (# packs/per year x # years smoked) Recent History of coughing up blood  no Unexplained weight loss? no ( >Than 15 pounds within the last 6 months ) Prior History Lung / other cancer no (Diagnosis within the last 5 years already requiring surveillance chest CT Scans). Smoking Status Former Smoker Former Smokers: Years since quit: 2 years  Quit Date: 2013  Visit Components: Discussion included one or more decision making aids. yes Discussion included risk/benefits of screening. yes Discussion included potential follow up diagnostic testing for abnormal scans. yes Discussion included meaning and risk of over diagnosis. yes Discussion included meaning and risk of False Positives. yes Discussion included meaning of total radiation exposure. yes  Counseling Included: Importance of adherence to annual lung cancer LDCT screening. yes Impact of comorbidities on ability to participate in the program. yes Ability and willingness to under diagnostic treatment. yes  Smoking Cessation Counseling: Current Smokers:  Discussed importance of smoking cessation. yes Information about tobacco cessation classes and interventions provided to patient. yes Patient provided  with ticket for LDCT Scan. no Symptomatic Patient. no  Counseling(Intermediate counseling: > three minutes) 99406 Diagnosis Code: Tobacco Use Z72.0 Asymptomatic Patient no  Counseling (Intermediate counseling: > three minutes counseling) H4742 Former Smokers:  Discussed the importance of maintaining cigarette abstinence. yes Diagnosis Code: Personal History of Nicotine Dependence. V95.638 Information about tobacco cessation classes and interventions provided to patient. Yes Patient provided with ticket for LDCT Scan. no Written Order for Lung Cancer Screening with LDCT placed in Epic. Yes (CT Chest Lung Cancer Screening Low Dose W/O CM) [id number] Z12.2-Screening of respiratory organs Z87.891-Personal history of nicotine dependence   Alyse Bach, RN

## 2023-12-31 ENCOUNTER — Encounter (HOSPITAL_BASED_OUTPATIENT_CLINIC_OR_DEPARTMENT_OTHER): Payer: Self-pay | Admitting: Pulmonary Disease

## 2024-01-04 ENCOUNTER — Ambulatory Visit (HOSPITAL_COMMUNITY)
Admission: RE | Admit: 2024-01-04 | Discharge: 2024-01-04 | Disposition: A | Discharge status: Home / Self Care | Source: Ambulatory Visit | Attending: Acute Care | Admitting: Acute Care

## 2024-01-04 DIAGNOSIS — J42 Unspecified chronic bronchitis: Secondary | ICD-10-CM | POA: Diagnosis not present

## 2024-01-04 DIAGNOSIS — Z87891 Personal history of nicotine dependence: Secondary | ICD-10-CM | POA: Diagnosis not present

## 2024-01-04 DIAGNOSIS — I10 Essential (primary) hypertension: Secondary | ICD-10-CM | POA: Diagnosis not present

## 2024-01-04 DIAGNOSIS — Z122 Encounter for screening for malignant neoplasm of respiratory organs: Secondary | ICD-10-CM | POA: Diagnosis not present

## 2024-01-04 DIAGNOSIS — J440 Chronic obstructive pulmonary disease with acute lower respiratory infection: Secondary | ICD-10-CM | POA: Diagnosis not present

## 2024-01-05 DIAGNOSIS — M67962 Unspecified disorder of synovium and tendon, left lower leg: Secondary | ICD-10-CM | POA: Diagnosis not present

## 2024-01-09 DIAGNOSIS — M67962 Unspecified disorder of synovium and tendon, left lower leg: Secondary | ICD-10-CM | POA: Diagnosis not present

## 2024-01-11 ENCOUNTER — Encounter

## 2024-01-11 ENCOUNTER — Encounter (HOSPITAL_BASED_OUTPATIENT_CLINIC_OR_DEPARTMENT_OTHER): Payer: Self-pay | Admitting: Pulmonary Disease

## 2024-01-11 ENCOUNTER — Other Ambulatory Visit: Payer: Self-pay | Admitting: Acute Care

## 2024-01-11 DIAGNOSIS — Z122 Encounter for screening for malignant neoplasm of respiratory organs: Secondary | ICD-10-CM

## 2024-01-11 DIAGNOSIS — Z87891 Personal history of nicotine dependence: Secondary | ICD-10-CM

## 2024-01-15 ENCOUNTER — Ambulatory Visit (HOSPITAL_COMMUNITY)

## 2024-01-15 NOTE — Telephone Encounter (Signed)
Please advise on pt's question

## 2024-01-16 DIAGNOSIS — M67962 Unspecified disorder of synovium and tendon, left lower leg: Secondary | ICD-10-CM | POA: Diagnosis not present

## 2024-01-16 NOTE — Telephone Encounter (Signed)
 FYI

## 2024-01-23 DIAGNOSIS — M67962 Unspecified disorder of synovium and tendon, left lower leg: Secondary | ICD-10-CM | POA: Diagnosis not present

## 2024-02-01 DIAGNOSIS — Z961 Presence of intraocular lens: Secondary | ICD-10-CM | POA: Diagnosis not present

## 2024-02-01 DIAGNOSIS — H43813 Vitreous degeneration, bilateral: Secondary | ICD-10-CM | POA: Diagnosis not present

## 2024-02-02 DIAGNOSIS — M67962 Unspecified disorder of synovium and tendon, left lower leg: Secondary | ICD-10-CM | POA: Diagnosis not present

## 2024-02-06 DIAGNOSIS — M67962 Unspecified disorder of synovium and tendon, left lower leg: Secondary | ICD-10-CM | POA: Diagnosis not present

## 2024-02-13 DIAGNOSIS — M67962 Unspecified disorder of synovium and tendon, left lower leg: Secondary | ICD-10-CM | POA: Diagnosis not present

## 2024-02-20 DIAGNOSIS — M67962 Unspecified disorder of synovium and tendon, left lower leg: Secondary | ICD-10-CM | POA: Diagnosis not present

## 2024-02-27 DIAGNOSIS — M67962 Unspecified disorder of synovium and tendon, left lower leg: Secondary | ICD-10-CM | POA: Diagnosis not present

## 2024-03-05 DIAGNOSIS — M67962 Unspecified disorder of synovium and tendon, left lower leg: Secondary | ICD-10-CM | POA: Diagnosis not present

## 2024-03-14 ENCOUNTER — Encounter (HOSPITAL_BASED_OUTPATIENT_CLINIC_OR_DEPARTMENT_OTHER): Payer: Self-pay | Admitting: Pulmonary Disease

## 2024-03-14 ENCOUNTER — Ambulatory Visit (HOSPITAL_BASED_OUTPATIENT_CLINIC_OR_DEPARTMENT_OTHER): Admitting: Pulmonary Disease

## 2024-03-14 VITALS — BP 124/77 | HR 74 | Ht 70.0 in | Wt 237.0 lb

## 2024-03-14 DIAGNOSIS — G4733 Obstructive sleep apnea (adult) (pediatric): Secondary | ICD-10-CM | POA: Diagnosis not present

## 2024-03-14 DIAGNOSIS — J4489 Other specified chronic obstructive pulmonary disease: Secondary | ICD-10-CM | POA: Diagnosis not present

## 2024-03-14 DIAGNOSIS — J439 Emphysema, unspecified: Secondary | ICD-10-CM

## 2024-03-14 DIAGNOSIS — Z23 Encounter for immunization: Secondary | ICD-10-CM

## 2024-03-14 NOTE — Progress Notes (Signed)
 Subjective:    Patient ID: Ryan [name], male    DOB: July 03, 1956, 68 y.o.   MRN: [id number]    68 yo for FU of COPD,OSA & RLS -former smoker quit in 2010 , 70 Pyrs   He has used a CPAP machine for approximately 20 years, with the current machine set at 12 cm H2O. referred to Dr. Jerel Guthrie for pulmonary hypertension evaluation at [facility], echo did not show any evidence of pulm hypertension spirometry showed FEV1 of 78%   Discussed the use of AI scribe software for clinical note transcription with the patient, who gave verbal consent to proceed.  History of Present Illness  Discussed the use of AI scribe software for clinical note transcription with the patient, who gave verbal consent to proceed.  History of Present Illness   Ryan [name] is a 68 year old male with COPD who presents for follow-up of his lung function and sleep apnea management. He was referred by a previous pulmonary doctor for evaluation of potential pulmonary hypertension.  Pulmonary function tests indicate normal lung function with a lung capacity of 90%, though one parameter is slightly reduced to 74%. He experiences dyspnea, particularly when climbing stairs. He uses Wixela, one puff twice daily, and previously used Advair, which he believes was more effective.  A lung cancer screening identified a small nodule on the left lung, measuring 3-4 millimeters, which will be monitored annually.  He has ongoing head congestion and nasal drainage with light-colored mucus, but no chest involvement or cough. He takes Mucinex daily without significant improvement.  He uses a CPAP machine for obstructive sleep apnea with a pressure setting of 12 cm, achieving excellent compliance with over seven hours of use nightly. He faces issues with automatic refills for CPAP supplies and has not received communications from the supplier. He has a backup machine for travel.        Significant tests/ events reviewed Echo 05/2023  RVSP 25 Spirometry 2011 ratio 73, FEV1 78%, FVC 84%  PFT 12/2023 >> FEV1 90% , DLCO 74%     07/2010 CPAP titration >> 12cm, PLMs + 03/2005 NPSG RDI 35/h, low sat 85%  LDCT chest 01/2024 >> 4mm nodule LUL   Review of Systems  neg for any significant sore throat, dysphagia, itching, sneezing, nasal congestion or excess/ purulent secretions, fever, chills, sweats, unintended wt loss, pleuritic or exertional cp, hempoptysis, orthopnea pnd or change in chronic leg swelling. Also denies presyncope, palpitations, heartburn, abdominal pain, nausea, vomiting, diarrhea or change in bowel or urinary habits, dysuria,hematuria, rash, arthralgias, visual complaints, headache, numbness weakness or ataxia.      Objective:   Physical Exam  Gen. 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 Lung function tests show 90% capacity, above the COPD diagnostic threshold. Symptoms include occasional dyspnea, particularly on exertion. Smoking history has caused mild lung damage, but cessation should prevent progression. COPD is not expected to be life-threatening. - Continue Wixela,  one puff twice daily at 250/50 dosage.  Obstructive sleep apnea, well controlled with CPAP OSA is well controlled with CPAP at 12 cm H2O. Excellent compliance with over seven hours of use per night and minimal leak. Issues with supply management due to lack of communication from the supplier. - Contact  supplier to ensure automatic refill for CPAP supplies. - Send prescription to supplier to facilitate supply management.  Stable left lung pulmonary nodule, for annual surveillance A small nodule (3-4 mm) is present in the left lung. This is a common finding and not concerning at this time. Annual surveillance is recommended to monitor any changes. - Schedule annual lung cancer screening.  Acute upper respiratory infection (head congestion) Recent sore throat resolved, but head congestion persists with light-colored mucus. No chest involvement or cough. Likely a resolving head cold. - Continue Mucinex for symptom relief. - Monitor symptoms; if drainage increases or changes color, consider antibiotics.

## 2024-03-14 NOTE — Addendum Note (Signed)
 Addended by: ALINDA WINN KIDD on: 03/14/2024 04:33 PM   Modules accepted: Orders

## 2024-03-14 NOTE — Patient Instructions (Addendum)
 X Flu shot  X CPAP supplies to [facility]    VISIT SUMMARY: Today, we reviewed your lung function and sleep apnea management. Your lung function tests show normal capacity, and your sleep apnea is well controlled with your CPAP machine. We also discussed the small nodule found in your lung, which will be monitored annually. Additionally, we addressed your ongoing head congestion.  YOUR PLAN: -CHRONIC OBSTRUCTIVE PULMONARY DISEASE (COPD) WITH MILD EMPHYSEMA: COPD is a chronic lung condition that makes it hard to breathe due to lung damage, often from smoking. Your lung function is good, and the condition is mild. Continue using Wixela, one puff twice daily at 250/50 dosage.  -OBSTRUCTIVE SLEEP APNEA (OSA): OSA is a condition where your breathing stops and starts during sleep. Your condition is well controlled with your CPAP machine set at 12 cm H2O. Ensure you contact your supplier to manage automatic refills for your CPAP supplies. We will send a prescription to help with this.  -STABLE LEFT LUNG PULMONARY NODULE: A small nodule was found in your left lung, which is common and not concerning at this time. We will monitor it annually to check for any changes.  -ACUTE UPPER RESPIRATORY INFECTION (HEAD CONGESTION): You have ongoing head congestion likely due to a resolving head cold. Continue taking Mucinex for relief. If your symptoms worsen or the mucus changes color, we may consider antibiotics.  INSTRUCTIONS: Please follow up with your CPAP supplier to ensure you receive your supplies automatically. Schedule your annual lung cancer screening to monitor the nodule in your left lung. Continue taking Mucinex for your head congestion and monitor your symptoms.                      Contains text generated by Abridge.                                 Contains text generated by Abridge.

## 2024-04-23 DIAGNOSIS — E785 Hyperlipidemia, unspecified: Secondary | ICD-10-CM | POA: Diagnosis not present

## 2024-04-23 DIAGNOSIS — I700 Atherosclerosis of aorta: Secondary | ICD-10-CM | POA: Diagnosis not present

## 2024-04-23 DIAGNOSIS — Z125 Encounter for screening for malignant neoplasm of prostate: Secondary | ICD-10-CM | POA: Diagnosis not present

## 2024-04-23 DIAGNOSIS — Z79899 Other long term (current) drug therapy: Secondary | ICD-10-CM | POA: Diagnosis not present

## 2024-05-02 DIAGNOSIS — J440 Chronic obstructive pulmonary disease with acute lower respiratory infection: Secondary | ICD-10-CM | POA: Diagnosis not present

## 2024-05-02 DIAGNOSIS — I10 Essential (primary) hypertension: Secondary | ICD-10-CM | POA: Diagnosis not present

## 2024-05-02 DIAGNOSIS — E785 Hyperlipidemia, unspecified: Secondary | ICD-10-CM | POA: Diagnosis not present

## 2024-05-02 DIAGNOSIS — J431 Panlobular emphysema: Secondary | ICD-10-CM | POA: Diagnosis not present

## 2024-05-07 ENCOUNTER — Telehealth: Payer: Self-pay

## 2024-05-07 NOTE — Telephone Encounter (Signed)
 Who is your primary care physician: Gaither Langton  Reasons for the colonoscopy: Hx polyps  Have you had a colonoscopy before?  Yes 12-25-2017  Do you have family history of colon cancer? Yes Nephew  Previous colonoscopy with polyps removed? yes  Do you have a history colorectal cancer?   no  Are you diabetic? If yes, Type 1 or Type 2?    no  Do you have a prosthetic or mechanical heart valve? no  Do you have a pacemaker/defibrillator?   no  Have you had endocarditis/atrial fibrillation? no  Have you had joint replacement within the last 12 months?  no  Do you tend to be constipated or have to use laxatives? no  Do you have any history of drugs or alchohol?  Yes couple drinks after work  Do you use supplemental oxygen ?  yes CPAP  Have you had a stroke or heart attack within the last 6 months? no  Do you take weight loss medication?  no    Do you take any blood-thinning medications such as: (aspirin, warfarin, Plavix, Aggrenox)  no  If yes we need the name, milligram, dosage and who is prescribing doctor  Current Outpatient Medications on File Prior to Visit  Medication Sig Dispense Refill   fluticasone -salmeterol (WIXELA INHUB) 250-50 MCG/ACT AEPB Inhale 1 puff into the lungs in the morning and at bedtime. 180 each 3   lansoprazole (PREVACID) 15 MG capsule Take 15 mg by mouth every other day.     rosuvastatin (CRESTOR) 20 MG tablet Take 20 mg by mouth daily.     valsartan (DIOVAN) 80 MG tablet Take 80 mg by mouth daily.     zolpidem  (AMBIEN ) 10 MG tablet Take 10 mg by mouth at bedtime as needed for sleep.      No current facility-administered medications on file prior to visit.    Allergies  Allergen Reactions   Amoxicillin  Other (See Comments)   Latex Other (See Comments)    Skin redness   Tiotropium Bromide Other (See Comments)    Patient reports blurred vision after taking Spiriva     Pharmacy: [facility]  Primary Insurance Name: [employer] [id number]  Best  number where you can be reached: [phone]

## 2024-05-20 DIAGNOSIS — Z96611 Presence of right artificial shoulder joint: Secondary | ICD-10-CM | POA: Diagnosis not present

## 2024-05-20 DIAGNOSIS — Z471 Aftercare following joint replacement surgery: Secondary | ICD-10-CM | POA: Diagnosis not present

## 2024-05-20 DIAGNOSIS — Z96612 Presence of left artificial shoulder joint: Secondary | ICD-10-CM | POA: Diagnosis not present

## 2024-06-04 DIAGNOSIS — G4733 Obstructive sleep apnea (adult) (pediatric): Secondary | ICD-10-CM | POA: Diagnosis not present

## 2024-06-04 DIAGNOSIS — R0609 Other forms of dyspnea: Secondary | ICD-10-CM | POA: Diagnosis not present

## 2024-06-12 NOTE — Telephone Encounter (Signed)
 Ok to schedule. H/o mild COPD per pulmonology note. Compliant with cpap.  ASA 3. Rm 1,2 ok

## 2024-06-13 MED ORDER — PEG 3350-KCL-NA BICARB-NACL 420 G PO SOLR: 4000.0000 mL | Freq: Once | ORAL | 0 refills | Status: AC

## 2024-06-13 NOTE — Telephone Encounter (Signed)
 Questionnaire from recall, no referral needed

## 2024-06-13 NOTE — Telephone Encounter (Signed)
 Spoke with pt. He has been scheduled for 07/08/24. Aware will send instructions and rx for prep to pharmacy.

## 2024-06-13 NOTE — Addendum Note (Signed)
 Addended by: JEANELL GRAEME RAMAN on: 06/13/2024 09:09 AM   Modules accepted: Orders

## 2024-07-08 ENCOUNTER — Ambulatory Visit (HOSPITAL_COMMUNITY)

## 2024-07-08 ENCOUNTER — Ambulatory Visit (HOSPITAL_COMMUNITY)
Admission: RE | Admit: 2024-07-08 | Discharge: 2024-07-08 | Disposition: A | Discharge status: Home / Self Care | Attending: Internal Medicine | Admitting: Internal Medicine

## 2024-07-08 ENCOUNTER — Other Ambulatory Visit: Payer: Self-pay

## 2024-07-08 ENCOUNTER — Encounter (HOSPITAL_COMMUNITY): Payer: Self-pay | Admitting: Internal Medicine

## 2024-07-08 ENCOUNTER — Encounter (HOSPITAL_COMMUNITY)
Admission: RE | Disposition: A | Discharge status: Home / Self Care | Payer: Self-pay | Source: Home / Self Care | Attending: Internal Medicine

## 2024-07-08 DIAGNOSIS — K635 Polyp of colon: Secondary | ICD-10-CM | POA: Diagnosis not present

## 2024-07-08 DIAGNOSIS — E7800 Pure hypercholesterolemia, unspecified: Secondary | ICD-10-CM | POA: Diagnosis not present

## 2024-07-08 DIAGNOSIS — Z860101 Personal history of adenomatous and serrated colon polyps: Secondary | ICD-10-CM | POA: Diagnosis not present

## 2024-07-08 DIAGNOSIS — Z79899 Other long term (current) drug therapy: Secondary | ICD-10-CM | POA: Insufficient documentation

## 2024-07-08 DIAGNOSIS — J449 Chronic obstructive pulmonary disease, unspecified: Secondary | ICD-10-CM | POA: Insufficient documentation

## 2024-07-08 DIAGNOSIS — Z87891 Personal history of nicotine dependence: Secondary | ICD-10-CM | POA: Insufficient documentation

## 2024-07-08 DIAGNOSIS — Z1211 Encounter for screening for malignant neoplasm of colon: Secondary | ICD-10-CM | POA: Diagnosis not present

## 2024-07-08 DIAGNOSIS — Z7951 Long term (current) use of inhaled steroids: Secondary | ICD-10-CM | POA: Insufficient documentation

## 2024-07-08 DIAGNOSIS — D123 Benign neoplasm of transverse colon: Secondary | ICD-10-CM

## 2024-07-08 DIAGNOSIS — K5730 Diverticulosis of large intestine without perforation or abscess without bleeding: Secondary | ICD-10-CM

## 2024-07-08 DIAGNOSIS — I10 Essential (primary) hypertension: Secondary | ICD-10-CM | POA: Diagnosis not present

## 2024-07-08 HISTORY — PX: COLONOSCOPY: SHX5424

## 2024-07-08 HISTORY — PX: POLYPECTOMY: SHX149

## 2024-07-08 SURGERY — COLONOSCOPY
Anesthesia: General

## 2024-07-08 MED ORDER — PROPOFOL 10 MG/ML IV BOLUS
INTRAVENOUS | Status: DC | PRN
  Administered 2024-07-08: 75 mg via INTRAVENOUS

## 2024-07-08 MED ORDER — LACTATED RINGERS IV SOLN: INTRAVENOUS | Status: DC

## 2024-07-08 MED ORDER — LIDOCAINE 2% (20 MG/ML) 5 ML SYRINGE
INTRAMUSCULAR | Status: DC | PRN
  Administered 2024-07-08: 75 mg via INTRAVENOUS

## 2024-07-08 MED ORDER — PROPOFOL 500 MG/50ML IV EMUL
INTRAVENOUS | Status: DC | PRN
  Administered 2024-07-08: 150 ug/kg/min via INTRAVENOUS

## 2024-07-08 NOTE — Op Note (Signed)
 [facility] Patient Name: Ryan [name] Procedure Date: 07/08/2024 11:35 AM MRN: [id number] Date of Birth: Nov 15, 1955 Attending MD: Lamar Ozell Hollingshead , MD, [id number] CSN: [id number] Age: 68 Admit Type: Outpatient Procedure:                Colonoscopy Indications:              High risk colon cancer surveillance: Personal                            history of colonic polyps Providers:                Lamar Ozell Hollingshead, MD, Olam Ada, RN, Bascom Blush Referring MD:              Medicines:                Propofol  per Anesthesia Complications:            No immediate complications. Estimated Blood Loss:     Estimated blood loss was minimal. Procedure:                Pre-Anesthesia Assessment:                           - Prior to the procedure, a History and Physical                            was performed, and patient medications and                            allergies were reviewed. The patient's tolerance of                            previous anesthesia was also reviewed. The risks                            and benefits of the procedure and the sedation                            options and risks were discussed with the patient.                            All questions were answered, and informed consent                            was obtained. Prior Anticoagulants: The patient has                            taken no anticoagulant or antiplatelet agents. ASA                            Grade Assessment: III - A patient with severe  systemic disease. After reviewing the risks and                            benefits, the patient was deemed in satisfactory                            condition to undergo the procedure.                           After obtaining informed consent, the colonoscope                            was passed under direct vision. Throughout the                            procedure, the patient's  blood pressure, pulse, and                            oxygen  saturations were monitored continuously. The                            [id number] ([id number]) Colon was introduced through                            the anus and advanced to the the cecum, identified                            by appendiceal orifice and ileocecal valve. The                            colonoscopy was performed without difficulty. The                            patient tolerated the procedure well. The quality                            of the bowel preparation was adequate. The                            ileocecal valve, appendiceal orifice, and rectum                            were photographed. Scope In: 12:38:03 PM Scope Out: 12:50:22 PM Scope Withdrawal Time: 0 hours 8 minutes 23 seconds  Total Procedure Duration: 0 hours 12 minutes 19 seconds  Findings:      The perianal and digital rectal examinations were normal.      Scattered medium-mouthed diverticula were found in the sigmoid colon and       descending colon.      A 4 mm polyp was found in the splenic flexure. The polyp was sessile.       The polyp was removed with a cold snare. Resection and retrieval were       complete. Estimated blood loss was minimal.      The exam was otherwise without abnormality on direct  and retroflexion       views. Impression:               - Diverticulosis in the sigmoid colon and in the                            descending colon.                           - One 4 mm polyp at the splenic flexure, removed                            with a cold snare. Resected and retrieved.                           - The examination was otherwise normal on direct                            and retroflexion views. Moderate Sedation:      Moderate (conscious) sedation was personally administered by an       anesthesia professional. The following parameters were monitored: oxygen        saturation, heart rate, blood pressure, respiratory  rate, EKG, adequacy       of pulmonary ventilation, and response to care. Recommendation:           - Patient has a contact number available for                            emergencies. The signs and symptoms of potential                            delayed complications were discussed with the                            patient. Return to normal activities tomorrow.                            Written discharge instructions were provided to the                            patient.                           - Advance diet as tolerated.                           - Continue present medications.                           - Repeat colonoscopy date to be determined after                            pending pathology results are reviewed for                            surveillance.                           -  Return to GI office (date not yet determined). Procedure Code(s):        --- Professional ---                           [phone], Colonoscopy, flexible; with removal of                            tumor(s), polyp(s), or other lesion(s) by snare                            technique Diagnosis Code(s):        --- Professional ---                           Z86.010, Personal history of colonic polyps                           D12.3, Benign neoplasm of transverse colon (hepatic                            flexure or splenic flexure)                           K57.30, Diverticulosis of large intestine without                            perforation or abscess without bleeding CPT copyright 2022 American Medical Association. All rights reserved. The codes documented in this report are preliminary and upon coder review may  be revised to meet current compliance requirements. Lamar HERO. Nicholas Ossa, MD Lamar Ozell Hollingshead, MD 07/08/2024 12:57:10 PM This report has been signed electronically. Number of Addenda: 0

## 2024-07-08 NOTE — Discharge Instructions (Signed)
" °  Colonoscopy Discharge Instructions  Read the instructions outlined below and refer to this sheet in the next few weeks. These discharge instructions provide you with general information on caring for yourself after you leave the hospital. Your doctor may also give you specific instructions. While your treatment has been planned according to the most current medical practices available, unavoidable complications occasionally occur. If you have any problems or questions after discharge, call Dr. Shaaron at [phone]. ACTIVITY You may resume your regular activity, but move at a slower pace for the next 24 hours.  Take frequent rest periods for the next 24 hours.  Walking will help get rid of the air and reduce the bloated feeling in your belly (abdomen).  No driving for 24 hours (because of the medicine (anesthesia) used during the test).   Do not sign any important legal documents or operate any machinery for 24 hours (because of the anesthesia used during the test).  NUTRITION Drink plenty of fluids.  You may resume your normal diet as instructed by your doctor.  Begin with a light meal and progress to your normal diet. Heavy or fried foods are harder to digest and may make you feel sick to your stomach (nauseated).  Avoid alcoholic beverages for 24 hours or as instructed.  MEDICATIONS You may resume your normal medications unless your doctor tells you otherwise.  WHAT YOU CAN EXPECT TODAY Some feelings of bloating in the abdomen.  Passage of more gas than usual.  Spotting of blood in your stool or on the toilet paper.  IF YOU HAD POLYPS REMOVED DURING THE COLONOSCOPY: No aspirin products for 7 days or as instructed.  No alcohol for 7 days or as instructed.  Eat a soft diet for the next 24 hours.  FINDING OUT THE RESULTS OF YOUR TEST Not all test results are available during your visit. If your test results are not back during the visit, make an appointment with your caregiver to find out the  results. Do not assume everything is normal if you have not heard from your caregiver or the medical facility. It is important for you to follow up on all of your test results.  SEEK IMMEDIATE MEDICAL ATTENTION IF: You have more than a spotting of blood in your stool.  Your belly is swollen (abdominal distention).  You are nauseated or vomiting.  You have a temperature over 101.  You have abdominal pain or discomfort that is severe or gets worse throughout the day.     Diverticulosis information provided  1 polyp found and removed today  Further recommendations to follow pending review of pathology report "

## 2024-07-08 NOTE — Anesthesia Preprocedure Evaluation (Signed)
 Anesthesia Evaluation  Patient identified by MRN, date of birth, ID band Patient awake    Reviewed: Allergy & Precautions, H&P , NPO status , Patient's Chart, lab work & pertinent test results  Airway Mallampati: II  TM Distance: >3 FB Neck ROM: Full    Dental no notable dental hx.    Pulmonary neg pulmonary ROS, former smoker   Pulmonary exam normal breath sounds clear to auscultation       Cardiovascular hypertension, negative cardio ROS Normal cardiovascular exam Rhythm:Regular Rate:Normal     Neuro/Psych negative neurological ROS  negative psych ROS   GI/Hepatic negative GI ROS, Neg liver ROS,,,  Endo/Other  negative endocrine ROS    Renal/GU negative Renal ROS  negative genitourinary   Musculoskeletal negative musculoskeletal ROS (+)    Abdominal   Peds negative pediatric ROS (+)  Hematology negative hematology ROS (+)   Anesthesia Other Findings   Reproductive/Obstetrics negative OB ROS                              Anesthesia Physical Anesthesia Plan  ASA: 2  Anesthesia Plan: General   Post-op Pain Management:    Induction: Intravenous  PONV Risk Score and Plan:   Airway Management Planned: Nasal Cannula  Additional Equipment:   Intra-op Plan:   Post-operative Plan:   Informed Consent: I have reviewed the patients History and Physical, chart, labs and discussed the procedure including the risks, benefits and alternatives for the proposed anesthesia with the patient or authorized representative who has indicated his/her understanding and acceptance.     Dental advisory given  Plan Discussed with: CRNA  Anesthesia Plan Comments:         Anesthesia Quick Evaluation

## 2024-07-08 NOTE — Anesthesia Postprocedure Evaluation (Signed)
"   Anesthesia Post Note  Patient: Ryan [name]  Procedure(s) Performed: COLONOSCOPY POLYPECTOMY, INTESTINE  Patient location during evaluation: PACU Anesthesia Type: General Level of consciousness: awake and alert Pain management: pain level controlled Vital Signs Assessment: post-procedure vital signs reviewed and stable Respiratory status: spontaneous breathing, nonlabored ventilation, respiratory function stable and patient connected to nasal cannula oxygen  Cardiovascular status: blood pressure returned to baseline and stable Postop Assessment: no apparent nausea or vomiting Anesthetic complications: no   No notable events documented.   Last Vitals:  Vitals:   07/08/24 1108 07/08/24 1256  BP: (!) 147/69 120/73  Pulse: 65 68  Resp: 16 20  Temp: 36.6 C 36.6 C  SpO2: 94% 95%    Last Pain:  Vitals:   07/08/24 1256  TempSrc: Oral  PainSc: 0-No pain                 Andrea Limes      "

## 2024-07-08 NOTE — Transfer of Care (Signed)
 Immediate Anesthesia Transfer of Care Note  Patient: Ryan [name]  Procedure(s) Performed: COLONOSCOPY POLYPECTOMY, INTESTINE  Patient Location: PACU and Endoscopy Unit  Anesthesia Type:General  Level of Consciousness: awake  Airway & Oxygen  Therapy: Patient Spontanous Breathing  Post-op Assessment: Report given to RN  Post vital signs: Reviewed and stable  Last Vitals:  Vitals Value Taken Time  BP    Temp    Pulse    Resp    SpO2      Last Pain:  Vitals:   07/08/24 1108  TempSrc: Oral  PainSc: 0-No pain      Patients Stated Pain Goal: 7 (07/08/24 1108)  Complications: No notable events documented.

## 2024-07-08 NOTE — H&P (Signed)
 @LOGO @   Gastroenterology Progress Note    Primary Care Physician:  Sheryle Carwin, MD Primary Gastroenterologist:  Dr. Shaaron  Pre-Procedure History & Physical: HPI:  Ryan [name] is a 68 y.o. male here for surveillance colonoscopy.  History of multiple colonic adenomas removed over time.  Past Medical History:  Diagnosis Date   Anemia    Arthritis    Asthma    COPD (chronic obstructive pulmonary disease) (HCC)    Depression    GERD (gastroesophageal reflux disease)    High cholesterol    diet controlled   Hypertension    diet controlled   [facility] spotted fever 2019   Sleep apnea    CPAP    Past Surgical History:  Procedure Laterality Date   ACHILLES TENDON SURGERY Left 10/10/2023   CATARACT EXTRACTION  2018   CATARACT EXTRACTION W/PHACO Right 02/23/2015   Procedure: CATARACT EXTRACTION PHACO AND INTRAOCULAR LENS PLACEMENT (IOC);  Surgeon: Dow JULIANNA Burke, MD;  Location: [facility];  Service: Ophthalmology;  Laterality: Right;  CDE:5.02   CHOLECYSTECTOMY     COLONOSCOPY  03/15/2005   Dr. Wil Slape:Rectal polyps (diminutive), status post cold biopsy/removal/normal colon. hyperplastic   COLONOSCOPY N/A 08/27/2014   Dr. Shaaron: Multiple rectal and colonic polyps removed, one which was a 1 cm sessile polyp. Cecal AVMS ablated as described above. colonic Diverticulosis. Path with rectal hyperplastic polyp, ascending colon polyps with benign colonic mucosa, sessile serrated polyp with associated lipoma. Recommendation for surveillance colonoscopy in 3 years.    COLONOSCOPY WITH PROPOFOL  N/A 12/25/2017   Procedure: COLONOSCOPY WITH PROPOFOL ;  Surgeon: Shaaron Lamar HERO, MD;  Location: [facility];  Service: Endoscopy;  Laterality: N/A;  1:45pm   ESOPHAGOGASTRODUODENOSCOPY N/A 08/27/2014   Dr. Shataya Winkles:Tiny distal esophageal erosions consistent with mild erosive reflux esophaigitis. Hiatal hernia. Antral erosions status post gastric biopsy. mild chronic gastritis, negative H.pylori    GIVENS CAPSULE STUDY N/A 11/05/2014   Procedure: GIVENS CAPSULE STUDY;  Surgeon: Lamar HERO Shaaron, MD;  Location: [facility];  Service: Endoscopy;  Laterality: N/A;  0800   KNEE ARTHROSCOPY Right    POLYPECTOMY  12/25/2017   Procedure: POLYPECTOMY;  Surgeon: Shaaron Lamar HERO, MD;  Location: [facility];  Service: Endoscopy;;   REVERSE SHOULDER ARTHROPLASTY  02/28/2023   TOTAL SHOULDER ARTHROPLASTY Left 02/28/2019   Procedure: TOTAL SHOULDER ARTHROPLASTY;  Surgeon: Melita Drivers, MD;  Location: [facility];  Service: Orthopedics;  Laterality: Left;     Prior to Admission medications  Medication Sig Start Date End Date Taking? Authorizing Provider  fluticasone -salmeterol (WIXELA INHUB) 250-50 MCG/ACT AEPB Inhale 1 puff into the lungs in the morning and at bedtime. 11/30/23  Yes Jude Harden GAILS, MD  lansoprazole (PREVACID) 15 MG capsule Take 15 mg by mouth every other day.   Yes [provider]  rosuvastatin (CRESTOR) 20 MG tablet Take 20 mg by mouth daily. 04/27/21  Yes [provider]  valsartan (DIOVAN) 80 MG tablet Take 80 mg by mouth daily.   Yes [provider]  zolpidem  (AMBIEN ) 10 MG tablet Take 10 mg by mouth at bedtime as needed for sleep.  03/09/10  Yes [provider]    Allergies as of 06/13/2024 - Review Complete 05/07/2024  Allergen Reaction Noted   Amoxicillin  Other (See Comments) 07/07/2022   Latex Other (See Comments) 11/30/2010   Tiotropium bromide Other (See Comments) 08/18/2014    Family History  Problem Relation Age of Onset   Cancer Mother  unsure primary   Cancer Father        prostate   Colon cancer Neg Hx     Social History   Socioeconomic History   Marital status: Married    Spouse name: Joen   Number of children: 0   Years of education: Not on file   Highest education level: Some college, no degree  Occupational History   Occupation: [employer]  Tobacco Use   Smoking status: Former     Current packs/day: 0.00    Average packs/day: 2.0 packs/day for 40.0 years (80.0 ttl pk-yrs)    Types: Cigarettes    Start date: 10/16/1971    Quit date: 10/16/2011    Years since quitting: 12.7   Smokeless tobacco: Never  Vaping Use   Vaping status: Never Used  Substance and Sexual Activity   Alcohol use: Yes    Alcohol/week: 2.0 standard drinks of alcohol    Types: 2 Shots of liquor per week    Comment: couple of drinks of borboun every night   Drug use: No   Sexual activity: Yes    Birth control/protection: None  Other Topics Concern   Not on file  Social History Narrative   Lives with wife   Caffeine-coffee 3-4 c daily   Social Drivers of Health   Tobacco Use: Medium Risk (07/08/2024)   Patient History    Smoking Tobacco Use: Former    Smokeless Tobacco Use: Never    Passive Exposure: Not on Actuary Strain: Not on file  Food Insecurity: Not on file  Transportation Needs: Not on file  Physical Activity: Not on file  Stress: Not on file  Social Connections: Not on file  Intimate Partner Violence: Not on file  Depression (EYV7-0): Not on file  Alcohol Screen: Not on file  Housing: Unknown (06/01/2024)   Received from [facility]   Epic    At any time in the past 12 months, were you homeless or living in a shelter (including now)?: No    Unable to Pay for Housing in the Last Year: Not on file    Number of Times Moved in the Last Year: Not on file  Utilities: Not on file  Health Literacy: Not on file    Review of Systems   See HPI, otherwise negative ROS  Physical Exam: BP (!) 147/69   Pulse 65   Temp 97.8 F (36.6 C) (Oral)   Resp 16   Ht 5' 10 (1.778 m)   Wt 106.6 kg   SpO2 94%   BMI 33.72 kg/m  General:   Alert,  Well-developed, well-nourished, pleasant and cooperative in NAD Neck:  Supple; no masses or thyromegaly. No significant cervical adenopathy. Lungs:  Clear throughout to auscultation.   No wheezes, crackles,  or rhonchi. No acute distress. Heart:  Regular rate and rhythm; no murmurs, clicks, rubs,  or gallops. Abdomen: Non-distended, normal bowel sounds.  Soft and nontender without appreciable mass or hepatosplenomegaly.   Impression/Plan:   68 year old gentleman with a history of colonic adenomas here for surveillance colonoscopy.  The risks, benefits, limitations, alternatives and imponderables have been reviewed with the patient. Questions have been answered. All parties are agreeable.      Notice: This dictation was prepared with Dragon dictation along with smaller phrase technology. Any transcriptional errors that result from this process are unintentional and may not be corrected upon review.

## 2024-07-09 ENCOUNTER — Encounter (HOSPITAL_COMMUNITY): Payer: Self-pay | Admitting: Internal Medicine

## 2024-07-09 LAB — SURGICAL PATHOLOGY

## 2024-07-11 ENCOUNTER — Ambulatory Visit: Payer: Self-pay | Admitting: Internal Medicine

## 2024-07-24 ENCOUNTER — Encounter: Payer: Self-pay | Admitting: *Deleted

## 2024-07-24 NOTE — Progress Notes (Signed)
 Ryan [name]                                          MRN: [id number]   07/24/2024   The [facility] Quality Team Specialist reviewed this patient medical record for the purposes of chart review for care gap closure. The following were reviewed: abstraction for care gap closure-controlling blood pressure.    [facility] Quality Team
